# Patient Record
Sex: Male | Born: 1970 | Race: Black or African American | Hispanic: No | Marital: Single | Smoking: Current every day smoker
Health system: Southern US, Community
[De-identification: ages and names within clinical notes are randomized; demographics above are authoritative.]

## PROBLEM LIST (undated history)

## (undated) DIAGNOSIS — D492 Neoplasm of unspecified behavior of bone, soft tissue, and skin: Secondary | ICD-10-CM

## (undated) HISTORY — PX: OTHER SURGICAL HISTORY: SHX169

---

## 2009-03-31 HISTORY — PX: OTHER SURGICAL HISTORY: SHX169

## 2010-05-01 HISTORY — PX: OTHER SURGICAL HISTORY: SHX169

## 2015-10-18 ENCOUNTER — Encounter: Payer: Self-pay | Admitting: Neurology

## 2016-05-21 ENCOUNTER — Encounter (HOSPITAL_COMMUNITY): Payer: Self-pay | Admitting: Cardiology

## 2016-05-21 ENCOUNTER — Emergency Department (HOSPITAL_COMMUNITY)
Admission: EM | Admit: 2016-05-21 | Discharge: 2016-05-21 | Disposition: A | Discharge status: Home / Self Care | Payer: BLUE CROSS/BLUE SHIELD | Attending: Emergency Medicine | Admitting: Emergency Medicine

## 2016-05-21 ENCOUNTER — Emergency Department (HOSPITAL_COMMUNITY): Payer: BLUE CROSS/BLUE SHIELD

## 2016-05-21 DIAGNOSIS — M25561 Pain in right knee: Secondary | ICD-10-CM | POA: Insufficient documentation

## 2016-05-21 DIAGNOSIS — Y939 Activity, unspecified: Secondary | ICD-10-CM | POA: Diagnosis not present

## 2016-05-21 DIAGNOSIS — F17200 Nicotine dependence, unspecified, uncomplicated: Secondary | ICD-10-CM | POA: Diagnosis not present

## 2016-05-21 DIAGNOSIS — Y92009 Unspecified place in unspecified non-institutional (private) residence as the place of occurrence of the external cause: Secondary | ICD-10-CM | POA: Insufficient documentation

## 2016-05-21 DIAGNOSIS — S8991XA Unspecified injury of right lower leg, initial encounter: Secondary | ICD-10-CM | POA: Diagnosis present

## 2016-05-21 DIAGNOSIS — Y999 Unspecified external cause status: Secondary | ICD-10-CM | POA: Diagnosis not present

## 2016-05-21 DIAGNOSIS — W010XXA Fall on same level from slipping, tripping and stumbling without subsequent striking against object, initial encounter: Secondary | ICD-10-CM | POA: Insufficient documentation

## 2016-05-21 MED ORDER — IBUPROFEN 400 MG PO TABS
600.0000 mg | ORAL_TABLET | Freq: Once | ORAL | Status: AC
  Administered 2016-05-21: 600 mg via ORAL
  Filled 2016-05-21: qty 2

## 2016-05-21 MED ORDER — ACETAMINOPHEN 325 MG PO TABS
650.0000 mg | ORAL_TABLET | Freq: Once | ORAL | Status: AC
  Administered 2016-05-21: 650 mg via ORAL
  Filled 2016-05-21: qty 2

## 2016-05-21 NOTE — ED Triage Notes (Signed)
Twisted right knee yesterday.  Fell on ice

## 2016-05-21 NOTE — ED Notes (Signed)
After questions encouraged and answered. Pt is discharged. Crutch education and work note No other questions noted

## 2016-05-21 NOTE — ED Provider Notes (Signed)
[facility]-EM[facility]Y DEPT Provider Note   CSN: [id number] Arrival date & time: 05/21/16  0957   By signing my name below, I, Bobbie Stackhristopher Reid, attest that this documentation has been prepared under the direction and in the presence of Azalia BilisKevin Ozan Maclay, MD. Electronically Signed: Bobbie Stackhristopher Reid, Scribe. 05/21/16. 10:36 AM. History   Chief Complaint Chief Complaint  Patient presents with  . Knee Injury    The history is provided by the patient. No language interpreter was used.    HPI Comments: Charles Baker is a 46 y.o. male who presents to the Emergency Department complaining of right knee pain s/p fall that occurred yesterday morning around 9 am. Patient states that he was at his house yesterday and slipped on some black ice. He reports that he believe that he twisted his knee. He took 2 tablets of aleve yesterday with no significant improvement.  History reviewed. No pertinent past medical history.  There are no active problems to display for this patient.   Past Surgical History:  Procedure Laterality Date  . tumor     tumor removed from neck in 2009       Home Medications    Prior to Admission medications   Not on File    Family History History reviewed. No pertinent family history.  Social History Social History  Substance Use Topics  . Smoking status: Current Every Day Smoker  . Smokeless tobacco: Never Used  . Alcohol use Yes     Comment: beer occasional      Allergies   Patient has no known allergies.   Review of Systems Review of Systems A complete 10 system review of systems was obtained and all systems are negative except as noted in the HPI and PMH.    Physical Exam Updated Vital Signs BP 124/77 (BP Location: Left Arm)   Pulse 80   Temp 98 F (36.7 C) (Oral)   Resp 18   Ht 5\' 11"  (1.803 m)   Wt 190 lb (86.2 kg)   SpO2 100%   BMI 26.50 kg/m   Physical Exam  Constitutional: He is oriented to person, place, and time. He appears  well-developed and well-nourished.  HENT:  Head: Normocephalic.  Eyes: EOM are normal.  Neck: Normal range of motion.  Pulmonary/Chest: Effort normal.  Abdominal: He exhibits no distension.  Musculoskeletal:  Normal extension of right knee. Painful ROM of right knee with mild swelling at the medial aspect.  Neurological: He is alert and oriented to person, place, and time.  Psychiatric: He has a normal mood and affect.  Nursing note and vitals reviewed.    ED Treatments / Results  DIAGNOSTIC STUDIES: Oxygen Saturation is 100% on RA, normal by my interpretation.    COORDINATION OF CARE: 10:37 AM Discussed treatment plan with pt at bedside and pt agreed to plan.  Labs (all labs ordered are listed, but only abnormal results are displayed) Labs Reviewed - No data to display  EKG  EKG Interpretation None       Radiology Dg Knee Complete 4 Views Right  Result Date: 05/21/2016 CLINICAL DATA:  Pain after fall EXAM: RIGHT KNEE - COMPLETE 4+ VIEW COMPARISON:  None. FINDINGS: No evidence of fracture, dislocation, or joint effusion. No evidence of arthropathy or other focal bone abnormality. Soft tissues are unremarkable. IMPRESSION: Negative. Electronically Signed   By: Gerome Samavid  Williams III M.D   On: 05/21/2016 11:02    Procedures Procedures (including critical care time)  Medications Ordered in ED Medications  acetaminophen (TYLENOL) tablet 650 mg (650 mg Oral Given 05/21/16 1047)  ibuprofen (ADVIL,MOTRIN) tablet 600 mg (600 mg Oral Given 05/21/16 1047)     Initial Impression / Assessment and Plan / ED Course  I have reviewed the triage vital signs and the nursing notes.  Pertinent labs & imaging results that were available during my care of the patient were reviewed by me and considered in my medical decision making (see chart for details).     11:08 AM Home with instructions for ice and rest.  Knee immobilization.  Crutches for comfort.  Ibuprofen and Tylenol for home.   He'll need orthopedic follow-up and possible MRI for evaluation of internal derangement.  Final Clinical Impressions(s) / ED Diagnoses   Final diagnoses:  Acute pain of right knee    New Prescriptions New Prescriptions   No medications on file   I personally performed the services described in this documentation, which was scribed in my presence. The recorded information has been reviewed and is accurate.       Azalia Bilis, MD 05/21/16 [phone]

## 2016-05-21 NOTE — ED Notes (Signed)
Went to do vitals pt gone to radiology 

## 2017-06-12 IMAGING — DX DG KNEE COMPLETE 4+V*R*
4 series · 4 of 4 positions shown · non-contrast
Comparison: None.

CLINICAL DATA: Pain after fall

EXAM:
RIGHT KNEE - COMPLETE 4+ VIEW

[knee ap]
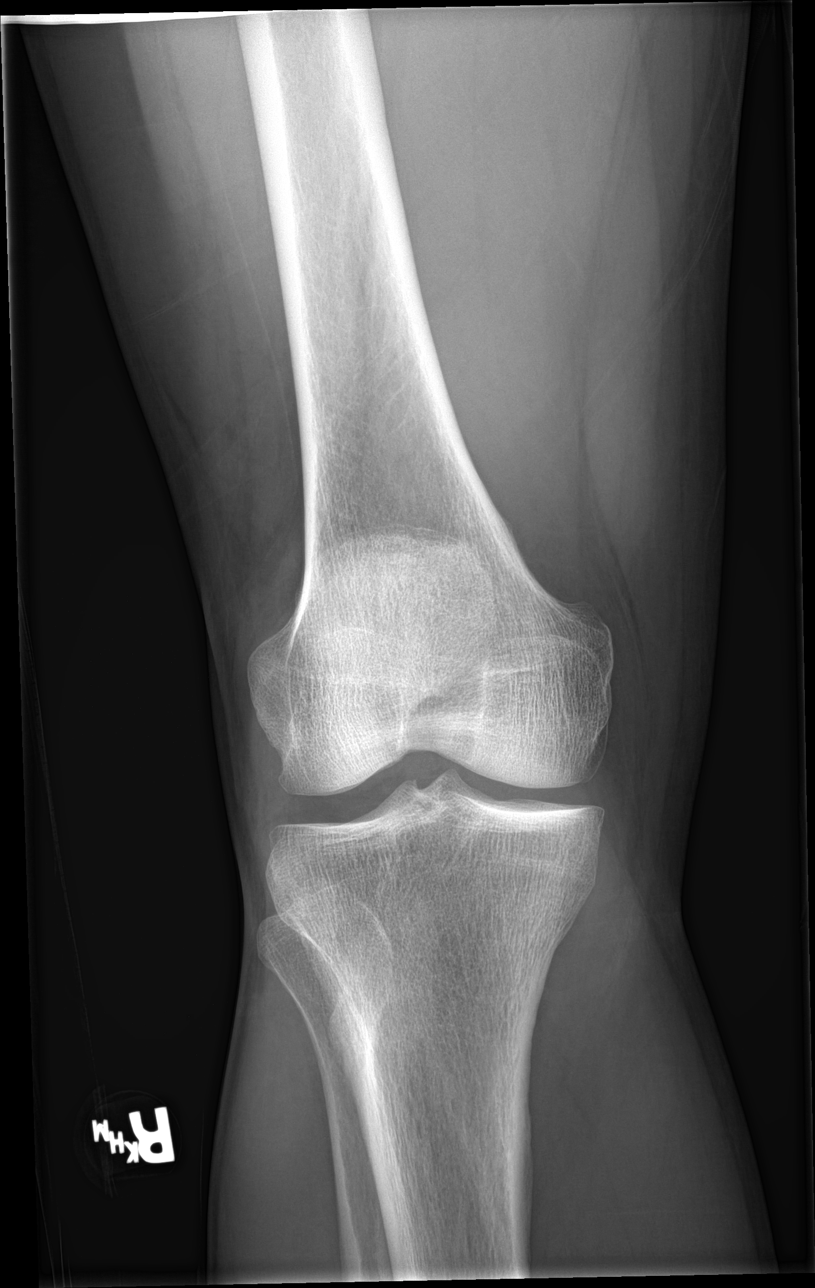

[knee obl (1 of 2)]
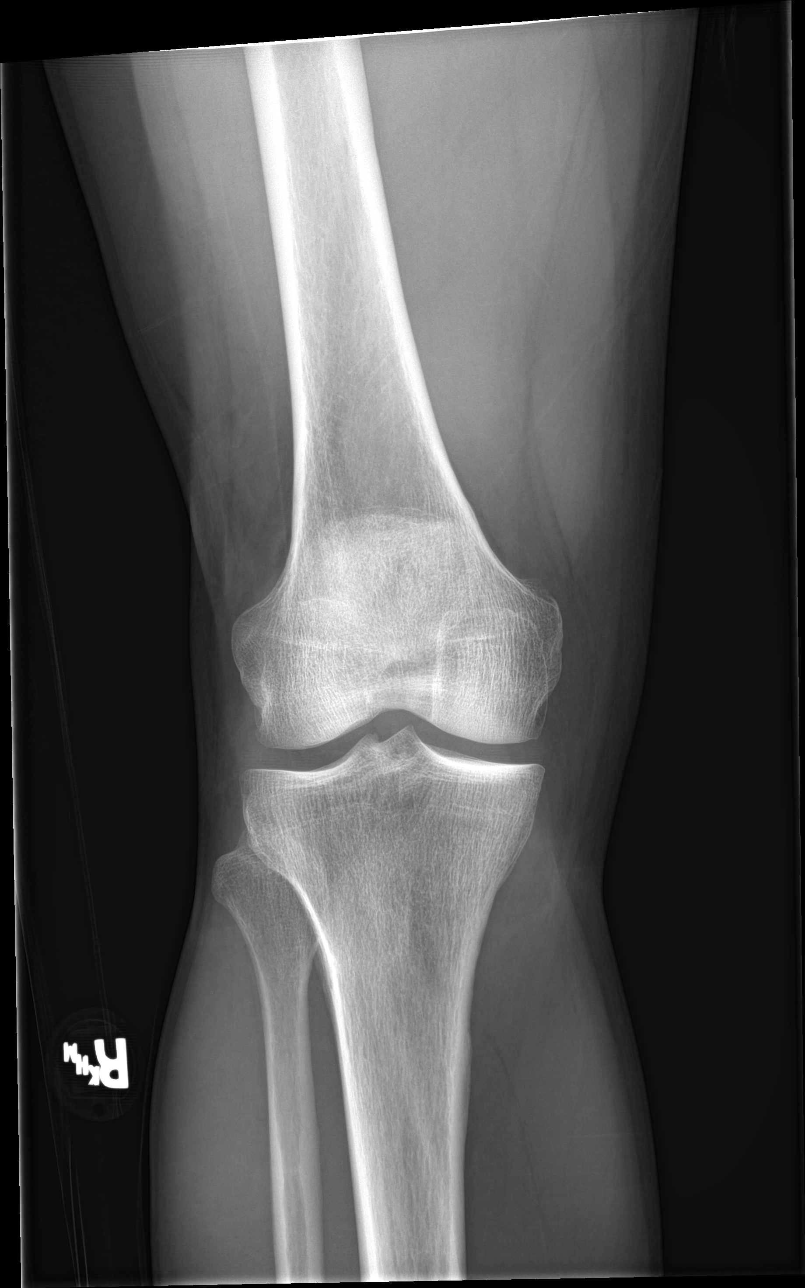

[knee obl (2 of 2)]
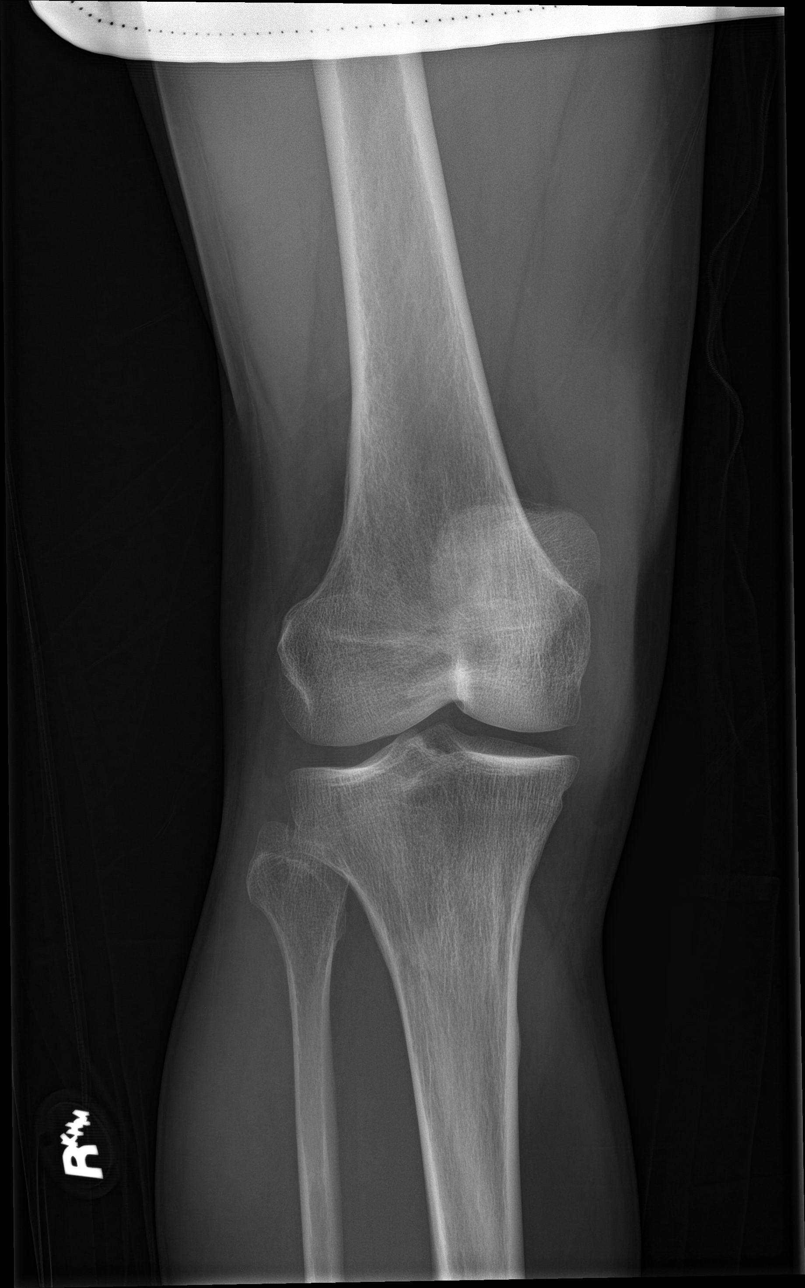

[knee lat]
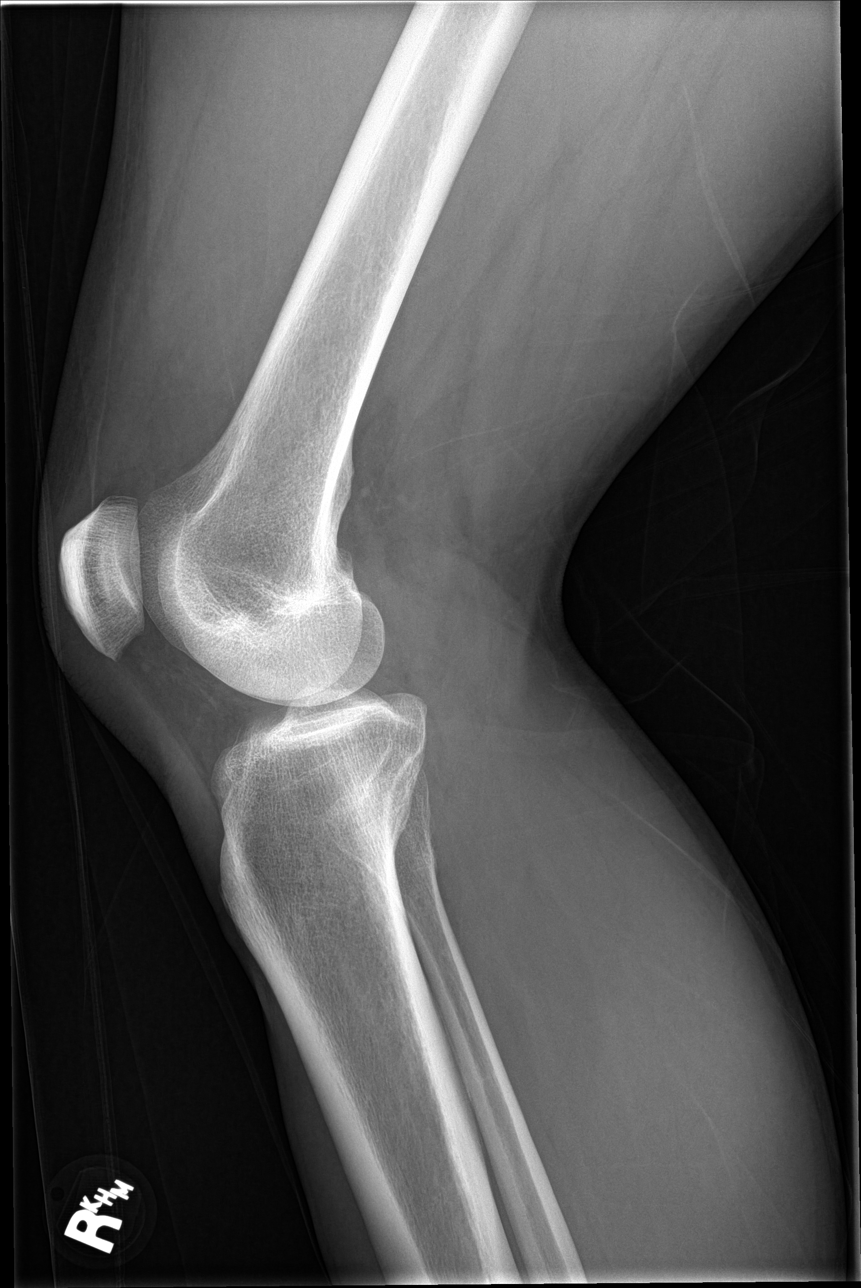

[4 of 4 positions shown; findings below may reference images not displayed]

FINDINGS: No evidence of fracture, dislocation, or joint effusion. No evidence
of arthropathy or other focal bone abnormality. Soft tissues are
unremarkable.
IMPRESSION: Negative.

## 2022-01-19 ENCOUNTER — Encounter: Payer: Self-pay | Admitting: Family Medicine

## 2022-01-19 ENCOUNTER — Ambulatory Visit (INDEPENDENT_AMBULATORY_CARE_PROVIDER_SITE_OTHER): Payer: Self-pay | Admitting: Family Medicine

## 2022-01-19 VITALS — BP 114/80 | HR 77 | Temp 98.4°F | Ht 71.0 in | Wt 213.0 lb

## 2022-01-19 DIAGNOSIS — M25542 Pain in joints of left hand: Secondary | ICD-10-CM

## 2022-01-19 DIAGNOSIS — M25541 Pain in joints of right hand: Secondary | ICD-10-CM

## 2022-01-19 DIAGNOSIS — R29898 Other symptoms and signs involving the musculoskeletal system: Secondary | ICD-10-CM

## 2022-01-19 DIAGNOSIS — Z113 Encounter for screening for infections with a predominantly sexual mode of transmission: Secondary | ICD-10-CM

## 2022-01-19 DIAGNOSIS — Z7689 Persons encountering health services in other specified circumstances: Secondary | ICD-10-CM

## 2022-01-19 DIAGNOSIS — Z114 Encounter for screening for human immunodeficiency virus [HIV]: Secondary | ICD-10-CM

## 2022-01-19 DIAGNOSIS — Z1159 Encounter for screening for other viral diseases: Secondary | ICD-10-CM

## 2022-01-19 DIAGNOSIS — Z0000 Encounter for general adult medical examination without abnormal findings: Secondary | ICD-10-CM

## 2022-01-19 DIAGNOSIS — Z125 Encounter for screening for malignant neoplasm of prostate: Secondary | ICD-10-CM

## 2022-01-19 NOTE — Progress Notes (Signed)
Complete physical exam  Patient: Charles Baker   DOB: [birth year]   51 y.o. Male  MRN: [id number]  Subjective:    Chief Complaint  Patient presents with   Charles Baker is a 51 y.o. male who presents today to establish care and for a complete physical exam. He reports consuming a general diet. The patient does not participate in regular exercise at present. He generally feels well. He reports sleeping well. He does have additional problems to discuss today.   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 C/o electrical current down left arm that wakes him from sleep and lasts all day for the last 4 months. Accompanied by muscle atrophy. Pain so severe he is "biting" his hand and using ice for relief. LUE weakness. Previously saw Dr Rafael Bihari, Neurology, unable to obtain records because he was incarcerated at the time of treatment.  Has had no healthcare since 2012.  He would like an MRI and referral for evaluation. 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 Would like disability. Vitals:   01/19/22 1119  Pulse: 77  Temp: 98.4 F (36.9 C)  SpO2: 97%    Most recent fall risk assessment:    01/19/2022   11:08 AM  Fall Risk   Falls in the past year? 0  Number falls in past yr: 0  Injury with Fall? 0     Most recent depression screenings:    01/19/2022   11:17 AM  PHQ 2/9 Scores  PHQ - 2 Score 0  PHQ- 9 Score 1    Vision:Not within last year , Dental: No current dental problems, STD: The patient denies history of sexually transmitted disease., and PSA: Agrees to PSA testing  History reviewed. No pertinent past medical history. 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/2009   L-tumor biopsy  2012   tumor     tumor removed from neck in 2009   Social History   Tobacco Use   Smoking status:  Every Day    Packs/day: 0.50    Years: 15.00    Total pack years: 7.50    Types: Cigarettes   Smokeless tobacco: Never  Substance Use Topics   Alcohol use: Yes    Alcohol/week: 2.0 standard drinks of alcohol    Types: 2 Shots of liquor per week    Comment: beer occasional    Drug use: No   Family History  Problem Relation Age of Onset   COPD Maternal Grandmother    Stroke Paternal Grandfather    No Known Allergies    Patient Care Team: Rubie Maid, FNP as PCP - General (Family Medicine)   Outpatient Medications Prior to Visit  Medication Sig   [DISCONTINUED] naproxen sodium (ANAPROX) 220 MG tablet Take 440 mg by mouth 2 (two) times daily with a meal.   No facility-administered medications prior to visit.    Review of Systems  Constitutional: Negative.   HENT: Negative.    Eyes: Negative.   Respiratory: Negative.    Cardiovascular: Negative.   Gastrointestinal: Negative.   Genitourinary: Negative.   Musculoskeletal:  Positive for joint pain.  Skin: Negative.   Neurological:  Positive for sensory change and weakness.       Bilateral upper extremities weakness, shoulder numbness, pain  left upper extremity  Psychiatric/Behavioral: Negative.            Objective:     Pulse 77   Temp 98.4 F (36.9 C) (Oral)   Ht $R'5\' 11"'vN$  (1.803 m)   Wt 213 lb (96.6 kg)   SpO2 97%   BMI 29.71 kg/m  BP Readings from Last 3 Encounters:  05/21/16 124/77   Wt Readings from Last 3 Encounters:  01/19/22 213 lb (96.6 kg)  05/21/16 190 lb (86.2 kg)      Physical Exam Vitals and nursing note reviewed.  Constitutional:      Appearance: Normal appearance. He is normal weight.  HENT:     Head: Normocephalic and atraumatic.     Right Ear: Tympanic membrane, ear canal and external ear normal.     Left Ear: Tympanic membrane, ear canal and external ear normal.     Nose: Nose normal.     Mouth/Throat:     Mouth: Mucous membranes are moist.     Pharynx: Oropharynx is clear.   Eyes:     Extraocular Movements: Extraocular movements intact.     Conjunctiva/sclera: Conjunctivae normal.     Pupils: Pupils are equal, round, and reactive to light.  Neck:     Vascular: No carotid bruit.  Cardiovascular:     Rate and Rhythm: Normal rate and regular rhythm.     Pulses: Normal pulses.     Heart sounds: Normal heart sounds.  Pulmonary:     Effort: Pulmonary effort is normal.     Breath sounds: Normal breath sounds.  Abdominal:     General: Abdomen is flat. Bowel sounds are normal.     Palpations: Abdomen is soft.  Musculoskeletal:     Cervical back: Normal range of motion and neck supple.  Skin:    General: Skin is warm and dry.     Capillary Refill: Capillary refill takes less than 2 seconds.  Neurological:     General: No focal deficit present.     Mental Status: He is alert and oriented to person, place, and time. Mental status is at baseline.     Sensory: Sensory deficit present.     Motor: Weakness present.     Comments: Bilateral upper extremity weakness and tone with limited range of motion, swelling and erythema to distal interphalangeal joints,    Psychiatric:        Mood and Affect: Mood normal.        Behavior: Behavior normal.        Thought Content: Thought content normal.        Judgment: Judgment normal.      No results found for any visits on 01/19/22. Last CBC No results found for: "WBC", "HGB", "HCT", "MCV", "MCH", "RDW", "PLT" Last metabolic panel No results found for: "GLUCOSE", "NA", "K", "CL", "CO2", "BUN", "CREATININE", "EGFR", "CALCIUM", "PHOS", "PROT", "ALBUMIN", "LABGLOB", "AGRATIO", "BILITOT", "ALKPHOS", "AST", "ALT", "ANIONGAP" Last lipids No results found for: "CHOL", "HDL", "LDLCALC", "LDLDIRECT", "TRIG", "CHOLHDL" Last hemoglobin A1c No results found for: "HGBA1C" Last thyroid functions No results found for: "TSH", "T3TOTAL", "T4TOTAL", "THYROIDAB" Last vitamin D No results found for: "25OHVITD2", "25OHVITD3",  "VD25OH" Last vitamin B12 and Folate No results found for: "VITAMINB12", "FOLATE"      Assessment & Plan:    Routine Health Maintenance and Physical Exam   There is no immunization history on file for this patient.  Health Maintenance  Topic Date Due   COVID-19 Vaccine (1) Never done   Hepatitis  C Screening  Never done   TETANUS/TDAP  Never done   COLONOSCOPY (Pts 45-46yrs Insurance coverage will need to be confirmed)  Never done   Zoster Vaccines- Shingrix (1 of 2) Never done   INFLUENZA VACCINE  Never done   HIV Screening  Completed   HPV VACCINES  Aged Out    Discussed health benefits of physical activity, and encouraged him to engage in regular exercise appropriate for his age and condition.  Physical exam, annual  Encounter to establish care  Bilateral arm weakness  Joint pain in fingers of both hands  Prostate cancer screening  Need for hepatitis C screening test  Screening for HIV (human immunodeficiency virus)  Screening examination for STD (sexually transmitted disease)  Complete physical exam performed including screenings and labs. Declines vaccinations at this time. Will obtain MRI and place neurology referral for upper extremity weakness and pain. In the presence of joint pain, redness, and erythema to hands I will obtain labs to assess for rheumatoid arthritis.    Return in about 1 day (around 01/20/2022) for labs. And 1 year for routine physical exam.     Rubie Maid, FNP

## 2022-01-20 ENCOUNTER — Other Ambulatory Visit: Payer: Medicaid Other

## 2022-01-20 DIAGNOSIS — M25541 Pain in joints of right hand: Secondary | ICD-10-CM

## 2022-01-20 DIAGNOSIS — Z114 Encounter for screening for human immunodeficiency virus [HIV]: Secondary | ICD-10-CM

## 2022-01-20 DIAGNOSIS — Z113 Encounter for screening for infections with a predominantly sexual mode of transmission: Secondary | ICD-10-CM

## 2022-01-20 DIAGNOSIS — Z0000 Encounter for general adult medical examination without abnormal findings: Secondary | ICD-10-CM

## 2022-01-20 DIAGNOSIS — Z125 Encounter for screening for malignant neoplasm of prostate: Secondary | ICD-10-CM

## 2022-01-20 DIAGNOSIS — Z1159 Encounter for screening for other viral diseases: Secondary | ICD-10-CM

## 2022-01-20 LAB — CBC WITH DIFFERENTIAL/PLATELET
Absolute Monocytes: 765 cells/uL (ref 200–950)
Eosinophils Absolute: 405 cells/uL (ref 15–500)
HCT: 43.9 % (ref 38.5–50.0)
Hemoglobin: 15.6 g/dL (ref 13.2–17.1)
MCH: 34 pg — ABNORMAL HIGH (ref 27.0–33.0)
Monocytes Relative: 10.2 %

## 2022-01-21 LAB — LIPID PANEL
Cholesterol: 195 mg/dL (ref <200)
HDL: 53 mg/dL (ref >=40)
LDL Cholesterol (Calc): 96 mg/dL (calc)
Non-HDL Cholesterol (Calc): 142 mg/dL (calc) — ABNORMAL HIGH (ref <130)
Total CHOL/HDL Ratio: 3.7 (calc) (ref <5.0)
Triglycerides: 347 mg/dL — ABNORMAL HIGH (ref <150)

## 2022-01-21 LAB — COMPLETE METABOLIC PANEL WITH GFR
AG Ratio: 1.6 (calc) (ref 1.0–2.5)
ALT: 30 U/L (ref 9–46)
AST: 32 U/L (ref 10–35)
Alkaline phosphatase (APISO): 71 U/L (ref 35–144)
BUN: 14 mg/dL (ref 7–25)
CO2: 26 mmol/L (ref 20–32)
Calcium: 9.3 mg/dL (ref 8.6–10.3)
Chloride: 105 mmol/L (ref 98–110)
Creat: 0.95 mg/dL (ref 0.70–1.30)
Globulin: 2.8 g/dL (calc) (ref 1.9–3.7)
Glucose, Bld: 94 mg/dL (ref 65–99)
Potassium: 4.2 mmol/L (ref 3.5–5.3)
Sodium: 141 mmol/L (ref 135–146)
Total Bilirubin: 0.9 mg/dL (ref 0.2–1.2)
eGFR: 98 mL/min/{1.73_m2} (ref >=60)

## 2022-01-21 LAB — CBC WITH DIFFERENTIAL/PLATELET
Basophils Absolute: 38 cells/uL (ref 0–200)
Basophils Relative: 0.5 %
Lymphs Abs: 1943 cells/uL (ref 850–3900)
MCHC: 35.5 g/dL (ref 32.0–36.0)
MCV: 95.6 fL (ref 80.0–100.0)
MPV: 11.4 fL (ref 7.5–12.5)
Neutro Abs: 4350 cells/uL (ref 1500–7800)
Neutrophils Relative %: 58 %
Platelets: 176 10*3/uL (ref 140–400)
RBC: 4.59 10*6/uL (ref 4.20–5.80)
RDW: 12.1 % (ref 11.0–15.0)
WBC: 7.5 10*3/uL (ref 3.8–10.8)

## 2022-01-21 LAB — C. TRACHOMATIS/N. GONORRHOEAE RNA
C. trachomatis RNA, TMA: NOT DETECTED
N. gonorrhoeae RNA, TMA: NOT DETECTED

## 2022-01-21 LAB — C-REACTIVE PROTEIN: CRP: 1 mg/L (ref <8.0)

## 2022-01-21 LAB — SEDIMENTATION RATE: Sed Rate: 2 mm/h (ref 0–15)

## 2022-01-23 ENCOUNTER — Encounter: Payer: Self-pay | Admitting: Neurology

## 2022-01-23 ENCOUNTER — Telehealth: Payer: Self-pay

## 2022-01-23 ENCOUNTER — Encounter: Payer: Self-pay | Admitting: Family Medicine

## 2022-01-23 LAB — COMPLETE METABOLIC PANEL WITH GFR
Albumin: 4.4 g/dL (ref 3.6–5.1)
Total Protein: 7.2 g/dL (ref 6.1–8.1)

## 2022-01-23 LAB — PSA: PSA: 0.37 ng/mL (ref <=4.00)

## 2022-01-23 LAB — HIV ANTIBODY (ROUTINE TESTING W REFLEX): HIV 1&2 Ab, 4th Generation: NONREACTIVE

## 2022-01-23 LAB — RPR: RPR Ser Ql: NONREACTIVE

## 2022-01-23 LAB — HEPATITIS C ANTIBODY: Hepatitis C Ab: NONREACTIVE

## 2022-01-23 LAB — CBC WITH DIFFERENTIAL/PLATELET
Eosinophils Relative: 5.4 %
Total Lymphocyte: 25.9 %

## 2022-01-23 LAB — RHEUMATOID FACTOR: Rhuematoid fact SerPl-aCnc: <14 IU/mL (ref <14)

## 2022-01-23 NOTE — Telephone Encounter (Signed)
Pt called in wanting to ask NP about getting some pain meds stronger than ibuprofen until he gets all of his issues under control. Pt would like a call back if this is possible.  Cb#: [phone]

## 2022-01-23 NOTE — Telephone Encounter (Signed)
Spoke w/Arlene w/[facility], per pt request for MRI order, demographics and insurance card put up front to be fax  Fax :[phone] Phone: [phone]

## 2022-02-02 ENCOUNTER — Encounter: Payer: Self-pay | Admitting: Family Medicine

## 2022-02-02 DIAGNOSIS — M79602 Pain in left arm: Secondary | ICD-10-CM

## 2022-02-02 DIAGNOSIS — R29898 Other symptoms and signs involving the musculoskeletal system: Secondary | ICD-10-CM

## 2022-02-03 NOTE — Telephone Encounter (Signed)
Please advice  

## 2022-02-06 ENCOUNTER — Other Ambulatory Visit: Payer: Self-pay | Admitting: Family Medicine

## 2022-02-06 ENCOUNTER — Telehealth: Payer: Self-pay | Admitting: Family Medicine

## 2022-02-06 ENCOUNTER — Telehealth: Payer: Self-pay

## 2022-02-06 MED ORDER — DULOXETINE HCL 30 MG PO CPEP: 30.0000 mg | ORAL_CAPSULE | Freq: Every day | ORAL | 3 refills | Status: DC

## 2022-02-06 NOTE — Telephone Encounter (Signed)
Spoke with patient regarding MRI results obtained today via fax. I have placed a STAT neurosurgery referral for further management. His left arm pain is unrelieved by the Voataren gel, I have ordered Cymbalta 30mg  daily for his neuropathic pain and instructed him to call back if symptoms worsen.

## 2022-02-06 NOTE — Telephone Encounter (Signed)
Amber, NP called and spoke w/pt today.

## 2022-02-06 NOTE — Telephone Encounter (Signed)
Pt called in stating that he would like to find a way to discuss results of his MRI. Pt wanted to know if he needed to schedule an appt with NP to look over results. Please advise.  Cb#: [phone]

## 2022-02-13 ENCOUNTER — Telehealth: Payer: Self-pay | Admitting: Family Medicine

## 2022-02-13 MED ORDER — DULOXETINE HCL 30 MG PO CPEP: 30.0000 mg | ORAL_CAPSULE | Freq: Every day | ORAL | 3 refills | Status: DC

## 2022-02-13 MED ORDER — GABAPENTIN 300 MG PO CAPS: 300.0000 mg | ORAL_CAPSULE | Freq: Three times a day (TID) | ORAL | 3 refills | Status: DC

## 2022-02-13 NOTE — Telephone Encounter (Signed)
Patient requested medication change due to cost but was able to obtain original prescription.

## 2022-02-17 ENCOUNTER — Telehealth: Payer: Self-pay

## 2022-02-17 NOTE — Telephone Encounter (Signed)
Spoke w/pharmacy staff is aware and will not fill gabapentin per Amber,NP

## 2022-02-20 NOTE — Telephone Encounter (Signed)
Already called pharmacy, already been cancelled.

## 2022-03-28 NOTE — Progress Notes (Deleted)
Initial neurology clinic note  SERVICE DATE: 03/31/22  Reason for Evaluation: Consultation requested by Charles Meo, FNP for an opinion regarding burning pain in left arm with muscle atrophy. My final recommendations will be communicated back to the requesting physician by way of shared medical record or letter to requesting physician via [place] mail.  HPI: This is [name], a 51 y.o. ***-handed male with a medical history of *** who presents to neurology clinic with the chief complaint of ***. The patient is accompanied by ***.  ***  Patient established care with PCP, Charles Baker on 01/19/22. 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 He mentioned to PCP that he had burning in left arm for last 4 months and muscle atrophy.   Patient was previously seen by Dr. Sampson Goon in neurology many years ago while incarcerated with no medical care since 2012.  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 Stat referral was placed to NSGY and neurology.***  The patient has not*** had similar episodes of symptoms in the past. ***  Muscle bulk loss? *** Muscle pain? ***  Cramps/Twitching? *** Suggestion of myotonia/difficulty relaxing after contraction? ***  Fatigable weakness?*** Does strength improve after brief exercise?***  Able to brush hair/teeth without difficulty? *** Able to button shirts/use zips? *** Clumsiness/dropping grasped objects?*** Can you arise from squatted position easily? *** Able to get out of chair without using arms? *** Able to walk up steps easily? *** Use an assistive device to walk? *** Significant imbalance with walking? *** Falls?*** Any change in urine color, especially after exertion/physical activity? ***  The  patient denies*** symptoms suggestive of oculobulbar weakness including diplopia, ptosis, dysphagia, poor saliva control, dysarthria/dysphonia, impaired mastication, facial weakness/droop.  There are no*** neuromuscular respiratory weakness symptoms, particularly orthopnea>dyspnea.   Pseudobulbar affect is absent***.  The patient does not*** report symptoms referable to autonomic dysfunction including impaired sweating, heat or cold intolerance, excessive mucosal dryness, gastroparetic early satiety, postprandial abdominal bloating, constipation, bowel or bladder dyscontrol, erectile dysfunction*** or syncope/presyncope/orthostatic intolerance.  There are no*** complaints relating to other symptoms of small fiber modalities including paresthesia/pain.  The patient has not *** noticed any recent skin rashes nor does he*** report any constitutional symptoms like fever, night sweats, anorexia or unintentional weight loss.  EtOH use: ***  Restrictive diet? *** Family history of neuropathy/myopathy/NM disease?***  Previous labs, electrodiagnostics, and neuroimaging are summarized below, but pertinent findings include***  Any biopsy done? *** Current medications being tried for the patient's symptoms include ***  Prior medications that have been tried: ***   MEDICATIONS:  Outpatient Encounter Medications as of 03/31/2022  Medication Sig   DULoxetine (CYMBALTA) 30 MG capsule Take 1 capsule (30 mg total) by mouth daily.   No facility-administered encounter medications on file as of 03/31/2022.    PAST MEDICAL HISTORY: No past medical history on file.  PAST SURGICAL HISTORY: 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/2009   L-tumor biopsy  2012   tumor     tumor removed from neck in 2009    ALLERGIES: No Known Allergies  FAMILY HISTORY: Family History  Problem Relation Age of Onset   COPD Maternal Grandmother  Stroke Paternal Grandfather      SOCIAL HISTORY: Social History   Tobacco Use   Smoking status: Every Day    Packs/day: 0.50    Years: 15.00    Total pack years: 7.50    Types: Cigarettes   Smokeless tobacco: Never  Substance Use Topics   Alcohol use: Yes    Alcohol/week: 2.0 standard drinks of alcohol    Types: 2 Shots of liquor per week    Comment: beer occasional    Drug use: No   Social History   Social History Narrative   Not on file     OBJECTIVE: PHYSICAL EXAM: There were no vitals taken for this visit.  General:*** General appearance: Awake and alert. No distress. Cooperative with exam.  Skin: No obvious rash or jaundice. HEENT: Atraumatic. Anicteric. Lungs: Non-labored breathing on room air  Heart: Regular Abdomen: Soft, non tender. Extremities: No edema. No obvious deformity.  Musculoskeletal: No obvious joint swelling. Psych: Affect appropriate.  Neurological: Mental Status: Alert. Speech fluent. No pseudobulbar affect Cranial Nerves: CNII: No RAPD. Visual fields grossly intact. CNIII, IV, VI: PERRL. No nystagmus. EOMI. CN V: Facial sensation intact bilaterally to fine touch. Masseter clench strong. Jaw jerk***. CN VII: Facial muscles symmetric and strong. No ptosis at rest or after sustained upgaze***. CN VIII: Hearing grossly intact bilaterally. CN IX: No hypophonia. CN X: Palate elevates symmetrically. CN XI: Full strength shoulder shrug bilaterally. CN XII: Tongue protrusion full and midline. No atrophy or fasciculations. No significant dysarthria*** Motor: Tone is ***. *** fasciculations in *** extremities. *** atrophy. No grip or percussive myotonia.***  Individual muscle group testing (MRC grade out of 5):  Movement     Neck flexion ***    Neck extension ***     Right Left   Shoulder abduction *** ***   Shoulder adduction *** ***   Shoulder ext rotation *** ***   Shoulder int rotation *** ***   Elbow flexion *** ***   Elbow extension *** ***   Wrist  extension *** ***   Wrist flexion *** ***   Finger abduction - FDI *** ***   Finger abduction - ADM *** ***   Finger extension *** ***   Finger distal flexion - 2/3 *** ***   Finger distal flexion - 4/5 *** ***   Thumb flexion - FPL *** ***   Thumb abduction - APB *** ***    Hip flexion *** ***   Hip extension *** ***   Hip adduction *** ***   Hip abduction *** ***   Knee extension *** ***   Knee flexion *** ***   Dorsiflexion *** ***   Plantarflexion *** ***   Inversion *** ***   Eversion *** ***   Great toe extension *** ***   Great toe flexion *** ***     Reflexes:  Right Left   Bicep *** ***   Tricep *** ***   BrRad *** ***   Knee *** ***   Ankle *** ***    Pathological Reflexes: Babinski: *** response bilaterally*** Hoffman: *** Troemner: *** Pectoral: *** Palmomental: *** Facial: *** Midline tap: *** Sensation: Pinprick: *** Vibration: *** Temperature: *** Proprioception: *** Coordination: Intact finger-to- nose-finger bilaterally. Romberg negative.*** Gait: Able to rise from chair with arms crossed unassisted. Normal, narrow-based gait. Able to tandem walk. Able to walk on toes and heels.***  Lab and Test Review: Internal labs: Normal or unremarkable: CBC, CMP, HIV, RPR, Hep C panel  External labs: ***  MRI cervical spine (01/30/22):   MRI right brachial plexus w/wo (08/28/2008): FINDINGS: There is no abnormal enhancement in the region of the  right brachial plexus roots, trunks, divisions, cords or  branches.  The trunks and divisions are poorly defined which may  be consistent with plexopathy/prior injury.  No masses are  identified.  The osseous structures are unremarkable.  Limited  views of the spinal canal are normal.     IMPRESSION: Poor definition of trunks and divisions which may be  consistent with prior injury. No mass.   ***  ASSESSMENT: GEDDY BOYDSTUN is a 51 y.o. male who presents for evaluation of ***. *** has a  relevant medical history of ***. *** neurological examination is pertinent for ***. Available diagnostic data is significant for ***. This constellation of symptoms and objective data would most likely localize to ***. ***  PLAN: -Blood work: *** ***  -Return to clinic ***  The impression above as well as the plan as outlined below were extensively discussed with the patient (in the company of ***) who voiced understanding. All questions were answered to their satisfaction.  The patient was counseled on pertinent fall precautions per the printed material provided today, and as noted under the "Patient Instructions" section below.***  When available, results of the above investigations and possible further recommendations will be communicated to the patient via telephone/MyChart. Patient to call office if not contacted after expected testing turnaround time.   Total time spent reviewing records, interview, history/exam, documentation, and coordination of care on day of encounter:  *** min   Thank you for allowing me to participate in patient's care.  If I can answer any additional questions, I would be pleased to do so.  Jacquelyne Balint, MD   CC: Charles Meo, FNP [address] [place] [address]  CC: Referring provider: Park Meo, FNP [address] [place],  [place] [address]

## 2022-03-31 ENCOUNTER — Ambulatory Visit: Payer: Self-pay | Admitting: Neurology

## 2022-04-05 ENCOUNTER — Other Ambulatory Visit: Payer: Self-pay | Admitting: Family Medicine

## 2022-04-05 ENCOUNTER — Telehealth: Payer: Self-pay | Admitting: Family Medicine

## 2022-04-05 DIAGNOSIS — M79602 Pain in left arm: Secondary | ICD-10-CM

## 2022-04-05 MED ORDER — DULOXETINE HCL 60 MG PO CPEP: 60.0000 mg | ORAL_CAPSULE | Freq: Two times a day (BID) | ORAL | 1 refills | Status: DC

## 2022-04-05 NOTE — Telephone Encounter (Signed)
Received call from patient's wife Charles Baker. Stated patient taking CYMBALTA for nerve pain and it's not helping; he was up all night in severe pain. Cream also not helping. She's requesting a different prescription; unable to get in for the neuro surgeon until January.  Pharmacy confirmed as:  [facility] [address] [place], [place] [address] Phone: [phone]  Requesting call back with provider's plan; patient doesn't use MyChart. Please advise at [phone], or [phone]

## 2022-04-05 NOTE — Telephone Encounter (Signed)
Pls advice  

## 2022-04-05 NOTE — Telephone Encounter (Signed)
Just spoke w/pt, he would like something for nerve pains, he stated that he is in a lot of pain, work sent him home today due to pain.   Pls advice?

## 2022-04-06 NOTE — Telephone Encounter (Signed)
Called and spoke w/pt. Per pcp, did try to get med (Gabapentin) however pt's insurance was not going to cover it. Pt voiced understanding and nothing further.

## 2022-05-09 NOTE — Progress Notes (Deleted)
Initial neurology clinic note  SERVICE DATE: 05/10/22  Reason for Evaluation: Consultation requested by Rubie Maid, FNP for an opinion regarding ***. My final recommendations will be communicated back to the requesting physician by way of shared medical record or letter to requesting physician via [place] mail.  HPI: This is [name], a 52 y.o. ***-handed male with a medical history of *** who presents to neurology clinic with the chief complaint of ***. The patient is accompanied by ***.  ***  Patient established care with PCP, Mila Merry on 01/19/22. 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 He mentioned to PCP that he had burning in left arm for last 4 months and muscle atrophy.   Patient was previously seen by Dr. Rafael Bihari in neurology many years ago while incarcerated with no medical care since 2012.  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 Stat referral was placed to [facility] and neurology.***  Vision problems? *** Hearing loss/tinnitus? *** Skin abnormalities? *** Headaches? ***  ***  The patient has not*** had similar episodes of symptoms in the past. ***  Muscle bulk loss? *** Muscle pain? ***  Cramps/Twitching? *** Suggestion of myotonia/difficulty relaxing after contraction? ***  Fatigable weakness?*** Does strength improve after brief exercise?***  Able to brush hair/teeth without difficulty? *** Able to button shirts/use zips? *** Clumsiness/dropping grasped objects?*** Can you arise from squatted position easily? *** Able to get out of chair without using arms? *** Able to walk up steps easily? *** Use an assistive device to walk? *** Significant imbalance with walking? *** Falls?*** Any change in urine  color, especially after exertion/physical activity? ***  The patient denies*** symptoms suggestive of oculobulbar weakness including diplopia, ptosis, dysphagia, poor saliva control, dysarthria/dysphonia, impaired mastication, facial weakness/droop.  There are no*** neuromuscular respiratory weakness symptoms, particularly orthopnea>dyspnea.   Pseudobulbar affect is absent***.  The patient does not*** report symptoms referable to autonomic dysfunction including impaired sweating, heat or cold intolerance, excessive mucosal dryness, gastroparetic early satiety, postprandial abdominal bloating, constipation, bowel or bladder dyscontrol, erectile dysfunction*** or syncope/presyncope/orthostatic intolerance.  There are no*** complaints relating to other symptoms of small fiber modalities including paresthesia/pain.  The patient has not *** noticed any recent skin rashes nor does he*** report any constitutional symptoms like fever, night sweats, anorexia or unintentional weight loss.  EtOH use: ***  Restrictive diet? *** Family history of neuropathy/myopathy/NM disease?***  Previous labs, electrodiagnostics, and neuroimaging are summarized below, but pertinent findings include***  Any biopsy done? *** Current medications being tried for the patient's symptoms include ***  Prior medications that have been tried: ***   MEDICATIONS:  Outpatient Encounter Medications as of 05/10/2022  Medication Sig   DULoxetine (CYMBALTA) 60 MG capsule Take 1 capsule (60 mg total) by mouth 2 (two) times daily.   No facility-administered encounter medications on file as of 05/10/2022.    PAST MEDICAL HISTORY: No past medical history on file.  PAST SURGICAL HISTORY: 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/2009   L-tumor biopsy  2012   tumor     tumor removed from neck in 2009    ALLERGIES: No Known Allergies  FAMILY HISTORY: Family History  Problem Relation  Age of Onset   COPD Maternal Grandmother    Stroke Paternal Grandfather     SOCIAL HISTORY: Social History   Tobacco Use   Smoking status: Every Day    Packs/day: 0.50    Years: 15.00    Total pack years: 7.50    Types: Cigarettes   Smokeless tobacco: Never  Substance Use Topics   Alcohol use: Yes    Alcohol/week: 2.0 standard drinks of alcohol    Types: 2 Shots of liquor per week    Comment: beer occasional    Drug use: No   Social History   Social History Narrative   Not on file     OBJECTIVE: PHYSICAL EXAM: There were no vitals taken for this visit.  General:*** General appearance: Awake and alert. No distress. Cooperative with exam.  Skin:  -Cafe au lait spots*** -cutaneous neurofibromas*** -Freckling of armpits/groin*** -Lisch nodules on eyes*** HEENT: Atraumatic. Anicteric. Lungs: Non-labored breathing on room air  Heart: Regular Abdomen: Soft, non tender. Extremities: No edema. No obvious deformity.  Musculoskeletal: No obvious joint swelling. Psych: Affect appropriate.  Neurological: Mental Status: Alert. Speech fluent. No pseudobulbar affect Cranial Nerves: CNII: No RAPD. Visual fields grossly intact. CNIII, IV, VI: PERRL. No nystagmus. EOMI. CN V: Facial sensation intact bilaterally to fine touch. Masseter clench strong. Jaw jerk***. CN VII: Facial muscles symmetric and strong. No ptosis at rest or after sustained upgaze***. CN VIII: Hearing grossly intact bilaterally. CN IX: No hypophonia. CN X: Palate elevates symmetrically. CN XI: Full strength shoulder shrug bilaterally. CN XII: Tongue protrusion full and midline. No atrophy or fasciculations. No significant dysarthria*** Motor: Tone is ***. *** fasciculations in *** extremities. *** atrophy. No grip or percussive myotonia.***  Individual muscle group testing (MRC grade out of 5):  Movement     Neck flexion ***    Neck extension ***     Right Left   Shoulder abduction *** ***    Shoulder adduction *** ***   Shoulder ext rotation *** ***   Shoulder int rotation *** ***   Elbow flexion *** ***   Elbow extension *** ***   Wrist extension *** ***   Wrist flexion *** ***   Finger abduction - FDI *** ***   Finger abduction - ADM *** ***   Finger extension *** ***   Finger distal flexion - 2/3 *** ***   Finger distal flexion - 4/5 *** ***   Thumb flexion - FPL *** ***   Thumb abduction - APB *** ***    Hip flexion *** ***   Hip extension *** ***   Hip adduction *** ***   Hip abduction *** ***   Knee extension *** ***   Knee flexion *** ***   Dorsiflexion *** ***   Plantarflexion *** ***   Inversion *** ***   Eversion *** ***   Great toe extension *** ***   Great toe flexion *** ***     Reflexes:  Right Left   Bicep *** ***   Tricep *** ***   BrRad *** ***   Knee *** ***   Ankle *** ***    Pathological Reflexes: Babinski: *** response bilaterally*** Hoffman: *** Troemner: *** Pectoral: *** Palmomental: *** Facial: *** Midline tap: *** Sensation: Pinprick: *** Vibration: *** Temperature: *** Proprioception: *** Coordination: Intact finger-to- nose-finger bilaterally. Romberg negative.*** Gait: Able to rise from chair with arms crossed unassisted. Normal, narrow-based gait. Able to tandem walk. Able to walk on toes and heels.***  Lab  and Test Review: Internal labs: Normal or unremarkable: CBC, CMP, HIV, RPR, Hep C panel ***  External labs: ***  Imaging: MRI cervical spine (01/30/22):    MRI right brachial plexus w/wo (08/28/2008): FINDINGS: There is no abnormal enhancement in the region of the  right brachial plexus roots, trunks, divisions, cords or  branches.  The trunks and divisions are poorly defined which may  be consistent with plexopathy/prior injury.  No masses are  identified.  The osseous structures are unremarkable.  Limited  views of the spinal canal are normal.     IMPRESSION: Poor definition of trunks and  divisions which may be  consistent with prior injury. No mass.   ***  ASSESSMENT: Charles Baker is a 51 y.o. male who presents for evaluation of ***. *** has a relevant medical history of ***. *** neurological examination is pertinent for ***. Available diagnostic data is significant for ***. This constellation of symptoms and objective data would most likely localize to ***. ***  PLAN: -Blood work: *** ***  -Return to clinic ***  The impression above as well as the plan as outlined below were extensively discussed with the patient (in the company of ***) who voiced understanding. All questions were answered to their satisfaction.  The patient was counseled on pertinent fall precautions per the printed material provided today, and as noted under the "Patient Instructions" section below.***  When available, results of the above investigations and possible further recommendations will be communicated to the patient via telephone/MyChart. Patient to call office if not contacted after expected testing turnaround time.   Total time spent reviewing records, interview, history/exam, documentation, and coordination of care on day of encounter:  *** min   Thank you for allowing me to participate in patient's care.  If I can answer any additional questions, I would be pleased to do so.  Kai Levins, MD   CC: Rubie Maid, FNP [id number] [id number]  CC: Referring provider: Rubie Maid, [place] [place],   [id number]

## 2022-05-10 ENCOUNTER — Ambulatory Visit: Payer: Medicaid Other | Admitting: Neurology

## 2023-02-05 ENCOUNTER — Ambulatory Visit: Payer: Medicaid Other | Admitting: Family Medicine

## 2023-02-12 ENCOUNTER — Encounter: Payer: Self-pay | Admitting: Family Medicine

## 2023-02-12 ENCOUNTER — Ambulatory Visit: Payer: Medicaid Other | Admitting: Family Medicine

## 2023-02-12 VITALS — BP 120/60 | HR 78 | Temp 98.0°F | Ht 71.0 in | Wt 205.0 lb

## 2023-02-12 DIAGNOSIS — Z1211 Encounter for screening for malignant neoplasm of colon: Secondary | ICD-10-CM | POA: Diagnosis not present

## 2023-02-12 DIAGNOSIS — G540 Brachial plexus disorders: Secondary | ICD-10-CM

## 2023-02-12 MED ORDER — GABAPENTIN 100 MG PO CAPS: 100.0000 mg | ORAL_CAPSULE | Freq: Three times a day (TID) | ORAL | 3 refills | Status: DC

## 2023-02-12 NOTE — Assessment & Plan Note (Signed)
Symptoms unchanged from prior exam. Weakness and decreased sensation to BUE, LUE>RUE. Sensory changes RLE consistent with radiculopathy. No saddle numbness or urinary or fecal incontinence. 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 Will refer to Neurosurgery. Start Gabapentin 100mg  TID for nerve pain.

## 2023-02-12 NOTE — Progress Notes (Signed)
Subjective:  HPI: Charles Baker is a 52 y.o. male presenting on 02/12/2023 for No chief complaint on file.   HPI Patient is in today for worsening pain in his left hand that is now occurring in his right hand on the thumb side, he is having associated weakness. These symptoms are not new but are worsening. He is also experiencing numbness to his right posterior calf and bottom of his foot for several weeks now. He does have a history of C6 nerve root tumor removal in 2009 and has had similar symptoms since then, he was told he has another tumor on his left side that has not been addressed.Marland Kitchen He was referred by me to Neurology last year at his initial visit to establish care. MRI 01/30/2022 did show nerve root tumor.   Review of Systems  All other systems reviewed and are negative.   Relevant past medical history reviewed and updated as indicated.   No past medical history on file.   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/2009   L-tumor biopsy  2012   tumor     tumor removed from neck in 2009    Allergies and medications reviewed and updated.   Current Outpatient Medications:    amoxicillin (AMOXIL) 500 MG tablet, Take 500 mg by mouth 3 (three) times daily., Disp: , Rfl:    gabapentin (NEURONTIN) 100 MG capsule, Take 1 capsule (100 mg total) by mouth 3 (three) times daily., Disp: 90 capsule, Rfl: 3  No Known Allergies  Objective:   BP 120/60   Pulse 78   Temp 98 F (36.7 C) (Oral)   Ht 5\' 11"  (1.803 m)   Wt 205 lb (93 kg)   SpO2 92%   BMI 28.59 kg/m      02/12/2023   12:14 PM 01/19/2022   11:19 AM 05/21/2016   10:27 AM  Vitals with BMI  Height 5\' 11"  5\' 11"  5\' 11"   Weight 205 lbs 213 lbs 190 lbs  BMI 28.6 29.72 26.6  Systolic 120 114 161  Diastolic 60 80 77  Pulse 78 77 80     Physical Exam Vitals and nursing note reviewed.  Constitutional:      Appearance: Normal appearance. He is normal weight.  HENT:      Head: Normocephalic and atraumatic.  Skin:    General: Skin is warm and dry.     Capillary Refill: Capillary refill takes less than 2 seconds.  Neurological:     General: No focal deficit present.     Mental Status: He is alert and oriented to person, place, and time. Mental status is at baseline.     Sensory: Sensory deficit present.     Motor: Weakness present.  Psychiatric:        Mood and Affect: Mood normal.        Behavior: Behavior normal.        Thought Content: Thought content normal.        Judgment: Judgment normal.     Assessment & Plan:  Brachial plexus mass Assessment & Plan: Symptoms unchanged from prior exam. Weakness and decreased sensation to BUE, LUE>RUE. Sensory changes RLE consistent with radiculopathy. No saddle numbness or urinary or fecal incontinence. 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 Will refer to Neurosurgery. Start Gabapentin 100mg  TID for nerve pain.  Orders: -     Ambulatory referral to Neurosurgery  Colon cancer screening -     Cologuard  Other orders -     Gabapentin; Take 1 capsule (100 mg total) by mouth 3 (three) times daily.  Dispense: 90 capsule; Refill: 3     Follow up plan: Return for annual physical with labs 1 week prior.  Park Meo, FNP

## 2023-03-21 NOTE — Progress Notes (Addendum)
Referring Physician:  Park Meo, FNP [address] [place],  [place] [address]  Primary Physician:  Charles Meo, FNP  History of Present Illness: 03/26/2023 Charles Baker is here today with a chief complaint of peripheral nerve sheath tumors.  He has had a previous right sided peripheral nerve sheath tumor resected.  He has had significant weakness in his right upper extremity ever since that time.  On the left upper extremity he also has significant weakness as well.  He is lost significant amount of sensation in his left upper extremity.  He has had a previous brachial plexus MRI demonstrating a left-sided tumor.  Feels like he is unable to lift his left upper extremity has difficulty with elbow flexion as well as external rotation.  This has been present for at least the past 12 years.  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  Review of Systems:  A 10 point review of systems is negative, except for the pertinent positives and negatives detailed in the HPI.  Past Medical History: History reviewed. No pertinent past medical history.  Past Surgical History: 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/2009   L-tumor biopsy  2012   tumor     tumor removed from neck in 2009    Allergies: Allergies as of 03/26/2023   (No Known Allergies)    Medications:  Current Outpatient Medications:    gabapentin (NEURONTIN) 100 MG capsule, Take 1 capsule (100 mg total) by mouth 3 (three) times daily., Disp: 90 capsule, Rfl: 3  Social History: Social History   Tobacco Use   Smoking status: Every Day    Current packs/day: 0.50    Average packs/day: 0.5 packs/day for 15.0 years (7.5 ttl pk-yrs)    Types: Cigarettes   Smokeless tobacco: Never  Substance Use Topics   Alcohol use: Yes    Alcohol/week: 2.0 standard drinks of alcohol    Types: 2 Shots of liquor per week    Comment: beer occasional     Drug use: No    Family Medical History: Family History  Problem Relation Age of Onset   COPD Maternal Grandmother    Stroke Paternal Grandfather     Physical Examination: Vitals:   03/26/23 1510  BP: 114/76    General: Patient is in no apparent distress. Attention to examination is appropriate.  Neck:   Supple.  Full range of motion.  Respiratory: Patient is breathing without any difficulty.   NEUROLOGICAL:     Awake, alert, oriented to person, place, and time.  Speech is clear and fluent.   Cranial Nerves: Pupils equal round and reactive to light.  Facial tone is symmetric. Shoulder shrug is symmetric. Tongue protrusion is midline.  There is no pronator drift.  Motor Exam:  Significant weakness in the proximal musculature on the left upper extremity.  Weakness in the deltoid biceps wrist extension and external rotation.  Severe wasting of his proximal musculature.  Decreased reflexes of the left bicep  Decreased station in the left upper extremity in the upper trunk distribution.  Gait is normal.     Medical Decision Making  Imaging: Patient in need of updated imaging.  Previous imaging reviewed demonstrating a bilateral nerve sheath tumors.  His left-sided tumor is the one in question and appears to be arising from the upper trunk  I have personally reviewed the images and electrodiagnostics and agree with  the above interpretation.  Assessment and Plan: [name] is a pleasant 52 y.o. 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  The right was previously resected along with the nerve root and he has had a significant deficit ever since that time.  On the left upper extremity is had a previous biopsy.  He continues to have pain in the left upper extremity as well as weakness in the upper trunk distribution.  He has had this for many years.  At this point he feels like his pain is continuing to bother him and impact his life so would like to have further evaluation.   Since his MRI is dated we will plan on getting a repeat MRI of his left sided brachial plexus with and without contrast to explore the anatomy.  We discussed that we could potentially perform a resection of this nerve tumor, given his already weak upper extremity we may cause him to have further weakness.  Also concern for possible loss of sensation in the hand given the distribution in the upper trunk.  Will plan on getting this MRI and following up with him.  Spent 30 minutes on care for this patient today, we spent this reviewing his records, discussing his previous care, evaluating him for his active problems, planning for imaging, and planning for future treatments.  Thank you for involving me in the care of this patient.    Lovenia Kim MD/MSCR Neurosurgery - Peripheral Nerve Surgery

## 2023-03-26 ENCOUNTER — Ambulatory Visit: Payer: Medicaid Other | Admitting: Neurosurgery

## 2023-03-26 ENCOUNTER — Encounter: Payer: Self-pay | Admitting: Neurosurgery

## 2023-03-26 VITALS — BP 114/76 | Ht 71.0 in | Wt 201.0 lb

## 2023-03-26 DIAGNOSIS — G540 Brachial plexus disorders: Secondary | ICD-10-CM

## 2023-04-10 ENCOUNTER — Ambulatory Visit (HOSPITAL_COMMUNITY)
Admission: RE | Admit: 2023-04-10 | Discharge: 2023-04-10 | Disposition: A | Discharge status: Home / Self Care | Payer: Medicaid Other | Source: Ambulatory Visit | Attending: Neurosurgery | Admitting: Neurosurgery

## 2023-04-10 DIAGNOSIS — G540 Brachial plexus disorders: Secondary | ICD-10-CM | POA: Insufficient documentation

## 2023-04-10 MED ORDER — GADOBUTROL 1 MMOL/ML IV SOLN
10.0000 mL | Freq: Once | INTRAVENOUS | Status: AC | PRN
  Administered 2023-04-10: 10 mL via INTRAVENOUS

## 2023-05-09 ENCOUNTER — Ambulatory Visit: Payer: Medicaid Other | Admitting: Neurosurgery

## 2023-05-09 DIAGNOSIS — G540 Brachial plexus disorders: Secondary | ICD-10-CM | POA: Diagnosis not present

## 2023-05-09 NOTE — Progress Notes (Signed)
 I had a follow-up phone visit today with Charles Baker to discuss his left-sided brachial plexus tumor.  He was at home and I was in the clinic.  He gave consent to go forward with a phone visit.  We have been following him for his left-sided brachial plexus tumor, he has significant proximal left-sided weakness and has had this for a long time.  He has found ways to work around it.  His most troublesome symptom is nerve pain that goes down to his hand in the C5 and C6 distribution.  He has tried gabapentin  and different creams and unfortunately feels like these are giving him less and less relief.  He has been trying conservative management but feels like he is getting to the point where he needs to have a more definitive treatment.  We got an updated MRI of his brachial plexus which demonstrated a 2.9 x 2.0 upper trunk brachial plexus tumor.  The anatomy is clearly defined, appears to be right lateral to the scalene musculature.  We discussed a tumor resection.  Given his significant extensive weakness in his upper extremity already in this distribution it is unlikely that he would get significantly worse.  The major changes noted to a nerve tumor resection in this case would likely be sensation loss or failure to control his pain.  He feels like he is tried all of the conservative measures and would like to move forward with treatment.  He would like to have a few months to prepare for surgery and would ideally have this done in April.  Will plan on working on his scheduling.  All of his questions were answered to his approval.  Spent a total of 15 minutes discussing his care.

## 2023-05-14 ENCOUNTER — Ambulatory Visit: Payer: Medicaid Other | Admitting: Family Medicine

## 2023-06-04 ENCOUNTER — Ambulatory Visit (INDEPENDENT_AMBULATORY_CARE_PROVIDER_SITE_OTHER): Payer: Medicaid Other | Admitting: Neurosurgery

## 2023-06-04 DIAGNOSIS — G540 Brachial plexus disorders: Secondary | ICD-10-CM

## 2023-06-04 NOTE — Addendum Note (Signed)
Addended by: Sharlot Gowda on: 06/04/2023 05:19 PM   Modules accepted: Orders

## 2023-06-04 NOTE — Progress Notes (Signed)
I had a follow-up phone call today with Mr. Montalto.  He was at home and I was in the office.  He gave consent to go forward with a phone visit discussing his left brachial plexus tumor.  The last time we had discussed his left-sided brachial plexus tumor he was looking forward to having this resected, however he wanted to have time to get his finances and order and was preferring a date in April or May.  He is reaching out today for evaluation and planning for a brachial plexus exploration and tumor resection.  Continues to have significant symptoms, continues to take gabapentin for his pain but however he is having less and to control.  He is interested in having a consultation with a pain medicine physician.  I let him know that I did reach out to his primary care provider Ms. Howard, to help with coordination.  Will plan to post his surgery for a date in May as he feels like he is moving towards his goals to be set up financially for the time he will need to be out of work.  We spent approximately 10 minutes discussing his care.

## 2023-06-05 ENCOUNTER — Telehealth: Payer: Self-pay

## 2023-06-05 DIAGNOSIS — G540 Brachial plexus disorders: Secondary | ICD-10-CM

## 2023-06-05 DIAGNOSIS — G894 Chronic pain syndrome: Secondary | ICD-10-CM

## 2023-06-05 NOTE — Telephone Encounter (Signed)
-----   Message from Penne LELON Sharps sent at 06/05/2023  3:32 PM EST ----- Regarding: RE: [facility] Yes please ----- Message ----- From: Annasofia Pohl, RN Sent: 06/05/2023   9:23 AM EST To: Penne LELON Sharps, MD Subject: RE: [facility]                             [facility] in [place] does medication management only (and no injections). Do you want me to place a referral there? ----- Message ----- From: Sharps Penne LELON, MD Sent: 06/05/2023   7:14 AM EST To: Othelia Gunner, RN Subject: FW: [facility]                             who is her medical management person that we can send a referral to?  I know there is 1 in [place] ----- Message ----- From: Kayla Jeoffrey RAMAN, FNP Sent: 06/04/2023   4:30 PM EST To: Penne LELON Sharps, MD Subject: RE: [facility]                             Erskin Penne, thank you for reaching out. I do not have a preferred clinic and we have been having quite a bit of trouble getting people in for pain management. We usually just place referral to pain clinic. ----- Message ----- From: Sharps Penne LELON, MD Sent: 06/04/2023   3:51 PM EST To: Jeoffrey RAMAN Kayla, FNP Subject: [facility]                                 Hi Ms. [facility] you are doing well.  I just had a phone visit with Mr. Tiley.  I have been following him for his left-sided brachial plexus tumor.  He like to have this resected in May.  We will go ahead and get him posted for that.  He mentioned that he was interested in more aggressive medical management of his current pain regimen, his gabapentin  is not giving him as much relief as it was previously.  Do you have any preferred medical pain management referrals?  Most of the pain management referrals that we do out of our office are for injection or stimulator therapy.  Let me know and I am happy to either make that referral or if you would be great as well.  Thank so much, Penne

## 2023-06-06 NOTE — Telephone Encounter (Signed)
 Referral faxed to [facility] [address].

## 2023-06-12 NOTE — Telephone Encounter (Signed)
Patient seen on 06/10/2023 and next visit is on 06/22/2023.

## 2023-07-03 ENCOUNTER — Ambulatory Visit: Payer: Medicaid Other | Admitting: Family Medicine

## 2023-07-12 ENCOUNTER — Ambulatory Visit: Payer: Medicaid Other | Admitting: Family Medicine

## 2023-08-10 ENCOUNTER — Telehealth: Payer: Self-pay

## 2023-08-28 NOTE — Telephone Encounter (Signed)
 Sent mychart message to patient on 08/10/23 about scheduling surgery. Patient read mychart message but has not contacted our office to schedule. Will close message and wait to hear from patient.

## 2023-09-18 DIAGNOSIS — M25579 Pain in unspecified ankle and joints of unspecified foot: Secondary | ICD-10-CM | POA: Insufficient documentation

## 2023-09-18 DIAGNOSIS — S8263XA Displaced fracture of lateral malleolus of unspecified fibula, initial encounter for closed fracture: Secondary | ICD-10-CM | POA: Insufficient documentation

## 2023-09-19 ENCOUNTER — Encounter (HOSPITAL_BASED_OUTPATIENT_CLINIC_OR_DEPARTMENT_OTHER): Payer: Self-pay | Admitting: Orthopaedic Surgery

## 2023-09-19 ENCOUNTER — Other Ambulatory Visit: Payer: Self-pay | Admitting: Orthopaedic Surgery

## 2023-09-19 ENCOUNTER — Other Ambulatory Visit: Payer: Self-pay

## 2023-09-19 DIAGNOSIS — S8262XA Displaced fracture of lateral malleolus of left fibula, initial encounter for closed fracture: Secondary | ICD-10-CM

## 2023-09-21 ENCOUNTER — Ambulatory Visit
Admission: RE | Admit: 2023-09-21 | Discharge: 2023-09-21 | Disposition: A | Discharge status: Home / Self Care | Source: Ambulatory Visit | Attending: Orthopaedic Surgery | Admitting: Orthopaedic Surgery

## 2023-09-21 DIAGNOSIS — S8262XA Displaced fracture of lateral malleolus of left fibula, initial encounter for closed fracture: Secondary | ICD-10-CM

## 2023-09-22 NOTE — Discharge Instructions (Signed)
 Netta Cedars, MD [facility]  Please read the following information regarding your care after surgery.  Medications  You only need a prescription for the narcotic pain medicine (ex. oxycodone, Percocet, Norco).  All of the other medicines listed below are available over the counter. ? Aleve 2 pills twice a day for the first 3 days after surgery. ? acetominophen (Tylenol) 650 mg every 4-6 hours as you need for minor to moderate pain ? oxycodone as prescribed for severe pain  ? To help prevent blood clots, take aspirin (81 mg) twice daily for at least 42 days after surgery.  You should also get up every hour while you are awake to move around.  Weight Bearing ? Do NOT bear any weight on the operated leg or foot. This means do NOT touch your surgical leg to the ground!  Cast / Splint / Dressing ? If you have a splint, do NOT remove this. Keep your splint, cast or dressing clean and dry.  Don't put anything (coat hanger, pencil, etc) down inside of it.  If it gets wet, call the office immediately to schedule an appointment for a cast change.  Swelling IMPORTANT: It is normal for you to have swelling where you had surgery. To reduce swelling and pain, keep at least 3 pillows under your leg so that your toes are above your nose and your heel is above the level of your hip.  It may be necessary to keep your foot or leg elevated for several weeks.  This is critical to helping your incisions heal and your pain to feel better.  Follow Up Call my office at [phone] when you are discharged from the hospital or surgery center to schedule an appointment to be seen 7-10 days after surgery.  Call my office at [phone] if you develop a fever >101.5 F, nausea, vomiting, bleeding from the surgical site or severe pain.     Post Anesthesia Home Care Instructions  Activity: Get plenty of rest for the remainder of the day. A responsible individual must stay with you for 24 hours following the  procedure.  For the next 24 hours, DO NOT: -Drive a car -Advertising copywriter -Drink alcoholic beverages -Take any medication unless instructed by your physician -Make any legal decisions or sign important papers.  Meals: Start with liquid foods such as gelatin or soup. Progress to regular foods as tolerated. Avoid greasy, spicy, heavy foods. If nausea and/or vomiting occur, drink only clear liquids until the nausea and/or vomiting subsides. Call your physician if vomiting continues.  Special Instructions/Symptoms: Your throat may feel dry or sore from the anesthesia or the breathing tube placed in your throat during surgery. If this causes discomfort, gargle with warm salt water. The discomfort should disappear within 24 hours.  If you had a scopolamine patch placed behind your ear for the management of post- operative nausea and/or vomiting:  1. The medication in the patch is effective for 72 hours, after which it should be removed.  Wrap patch in a tissue and discard in the trash. Wash hands thoroughly with soap and water. 2. You may remove the patch earlier than 72 hours if you experience unpleasant side effects which may include dry mouth, dizziness or visual disturbances. 3. Avoid touching the patch. Wash your hands with soap and water after contact with the patch.  Regional Anesthesia Blocks  1. You may not be able to move or feel the "blocked" extremity after a regional anesthetic block. This may last may last  from 3-48 hours after placement, but it will go away. The length of time depends on the medication injected and your individual response to the medication. As the nerves start to wake up, you may experience tingling as the movement and feeling returns to your extremity. If the numbness and inability to move your extremity has not gone away after 48 hours, please call your surgeon.   2. The extremity that is blocked will need to be protected until the numbness is gone and the  strength has returned. Because you cannot feel it, you will need to take extra care to avoid injury. Because it may be weak, you may have difficulty moving it or using it. You may not know what position it is in without looking at it while the block is in effect.  3. For blocks in the legs and feet, returning to weight bearing and walking needs to be done carefully. You will need to wait until the numbness is entirely gone and the strength has returned. You should be able to move your leg and foot normally before you try and bear weight or walk. You will need someone to be with you when you first try to ensure you do not fall and possibly risk injury.  4. Bruising and tenderness at the needle site are common side effects and will resolve in a few days.  5. Persistent numbness or new problems with movement should be communicated to the surgeon or the [facility] [phone] [facility] [phone]).

## 2023-09-22 NOTE — H&P (Signed)
 ORTHOPAEDIC SURGERY H&P  Subjective:  The patient presents with left ankle fracture.   Past Medical History:  Diagnosis Date   Nerve sheath tumor    left neck    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/2009   L-tumor biopsy  2012   tumor     tumor removed from neck in 2009     (Not in an outpatient encounter)    No Known Allergies  Social History   Socioeconomic History   Marital status: Single    Spouse name: Not on file   Number of children: Not on file   Years of education: Not on file   Highest education level: Not on file  Occupational History   Not on file  Tobacco Use   Smoking status: Every Day    Current packs/day: 0.50    Average packs/day: 0.5 packs/day for 15.0 years (7.5 ttl pk-yrs)    Types: Cigarettes   Smokeless tobacco: Never  Substance and Sexual Activity   Alcohol use: Yes    Alcohol/week: 2.0 standard drinks of alcohol    Types: 2 Shots of liquor per week    Comment: beer occasional    Drug use: No   Sexual activity: Yes  Other Topics Concern   Not on file  Social History Narrative   Not on file   Social Drivers of Health   Financial Resource Strain: Patient Declined (02/09/2023)   Overall Financial Resource Strain (CARDIA)    Difficulty of Paying Living Expenses: Patient declined  Food Insecurity: Patient Declined (02/09/2023)   Hunger Vital Sign    Worried About Running Out of Food in the Last Year: Patient declined    Ran Out of Food in the Last Year: Patient declined  Transportation Needs: Patient Declined (02/09/2023)   PRAPARE - Administrator, Civil Service (Medical): Patient declined    Lack of Transportation (Non-Medical): Patient declined  Physical Activity: Unknown (02/09/2023)   Exercise Vital Sign    Days of Exercise per Week: 6 days    Minutes of Exercise per Session: Patient declined  Stress: No Stress Concern Present (02/09/2023)   [employer] of  Occupational Health - Occupational Stress Questionnaire    Feeling of Stress : Not at all  Social Connections: Unknown (02/09/2023)   Social Connection and Isolation Panel [NHANES]    Frequency of Communication with Friends and Family: Patient declined    Frequency of Social Gatherings with Friends and Family: Patient declined    Attends Religious Services: Patient declined    Database administrator or Organizations: Patient declined    Attends Banker Meetings: Not on file    Marital Status: Patient declined  Intimate Partner Violence: Not on file     History reviewed. No pertinent family history.   Review of Systems Pertinent items are noted in HPI.  Objective: Vital signs in last 24 hours:    09/19/2023   11:24 AM 03/26/2023    3:10 PM 02/12/2023   12:14 PM  Vitals with BMI  Height 5\' 11"  5\' 11"  5\' 11"   Weight 205 lbs 201 lbs 205 lbs  BMI 28.6 28.05 28.6  Systolic  114 120  Diastolic  76 60  Pulse   78      EXAM: General: Well nourished, well developed. Awake, alert and oriented to time, place, person. Normal mood and affect. No apparent distress. Breathing room air.  Operative Lower Extremity: Alignment -  Neutral Deformity - None Skin intact Tenderness to palpation - left ankle 5/5 TA, PT, GS, Per, EHL, FHL Sensation intact to light touch throughout Palpable DP and PT pulses Special testing: None  The contralateral foot/ankle was examined for comparison and noted to be neurovascularly intact with no localized deformity, swelling, or tenderness.  Imaging Review All images taken were independently reviewed by me.  Assessment/Plan: The clinical and radiographic findings were reviewed and discussed at length with the patient.  The patient has left ankle fracture.  We spoke at length about the natural course of these findings. We discussed nonoperative and operative treatment options in detail.  The risks and benefits were presented and  reviewed. The risks due to hardware failure/irritation, new/persistent/recurrent infection, stiffness, nerve/vessel/tendon injury, nonunion/malunion of any fracture, wound healing issues, allograft usage, development of arthritis, failure of this surgery, possibility of external fixation in certain situations, possibility of delayed definitive surgery, need for further surgery, prolonged wound care including further soft tissue coverage procedures, thromboembolic events, anesthesia/medical complications/events perioperatively and beyond, amputation, death among others were discussed. The patient acknowledged the explanation and agreed to proceed with the plan.  Ali Ink  Orthopaedic Surgery [facility]

## 2023-09-26 ENCOUNTER — Ambulatory Visit (HOSPITAL_BASED_OUTPATIENT_CLINIC_OR_DEPARTMENT_OTHER)
Admission: RE | Admit: 2023-09-26 | Discharge: 2023-09-26 | Disposition: A | Discharge status: Home / Self Care | Attending: Orthopaedic Surgery | Admitting: Orthopaedic Surgery

## 2023-09-26 ENCOUNTER — Other Ambulatory Visit: Payer: Self-pay

## 2023-09-26 ENCOUNTER — Encounter (HOSPITAL_BASED_OUTPATIENT_CLINIC_OR_DEPARTMENT_OTHER)
Admission: RE | Disposition: A | Discharge status: Home / Self Care | Payer: Self-pay | Source: Home / Self Care | Attending: Orthopaedic Surgery

## 2023-09-26 ENCOUNTER — Ambulatory Visit (HOSPITAL_COMMUNITY)

## 2023-09-26 ENCOUNTER — Ambulatory Visit (HOSPITAL_BASED_OUTPATIENT_CLINIC_OR_DEPARTMENT_OTHER): Payer: Self-pay | Admitting: Anesthesiology

## 2023-09-26 ENCOUNTER — Encounter (HOSPITAL_BASED_OUTPATIENT_CLINIC_OR_DEPARTMENT_OTHER): Payer: Self-pay | Admitting: Orthopaedic Surgery

## 2023-09-26 DIAGNOSIS — S82862A Displaced Maisonneuve's fracture of left leg, initial encounter for closed fracture: Secondary | ICD-10-CM | POA: Insufficient documentation

## 2023-09-26 DIAGNOSIS — S8262XA Displaced fracture of lateral malleolus of left fibula, initial encounter for closed fracture: Secondary | ICD-10-CM | POA: Insufficient documentation

## 2023-09-26 DIAGNOSIS — S93432A Sprain of tibiofibular ligament of left ankle, initial encounter: Secondary | ICD-10-CM | POA: Diagnosis not present

## 2023-09-26 DIAGNOSIS — X58XXXA Exposure to other specified factors, initial encounter: Secondary | ICD-10-CM | POA: Insufficient documentation

## 2023-09-26 DIAGNOSIS — F17210 Nicotine dependence, cigarettes, uncomplicated: Secondary | ICD-10-CM | POA: Diagnosis not present

## 2023-09-26 HISTORY — PX: ORIF ANKLE FRACTURE: SHX5408

## 2023-09-26 HISTORY — PX: LIGAMENT REPAIR: SHX5444

## 2023-09-26 HISTORY — DX: Neoplasm of unspecified behavior of bone, soft tissue, and skin: D49.2

## 2023-09-26 HISTORY — PX: SYNDESMOSIS REPAIR: SHX5182

## 2023-09-26 SURGERY — OPEN REDUCTION INTERNAL FIXATION (ORIF) ANKLE FRACTURE
Anesthesia: General | Site: Ankle | Laterality: Right

## 2023-09-26 MED ORDER — CEFAZOLIN SODIUM-DEXTROSE 2-4 GM/100ML-% IV SOLN
2.0000 g | INTRAVENOUS | Status: AC
Start: 2023-09-26 — End: 2023-09-26
  Administered 2023-09-26: 2 g via INTRAVENOUS

## 2023-09-26 MED ORDER — FENTANYL CITRATE (PF) 100 MCG/2ML IJ SOLN
INTRAMUSCULAR | Status: AC
  Filled 2023-09-26: qty 2

## 2023-09-26 MED ORDER — MIDAZOLAM HCL 2 MG/2ML IJ SOLN
2.0000 mg | Freq: Once | INTRAMUSCULAR | Status: AC
  Administered 2023-09-26: 2 mg via INTRAVENOUS

## 2023-09-26 MED ORDER — PHENYLEPHRINE HCL (PRESSORS) 10 MG/ML IV SOLN
INTRAVENOUS | Status: DC | PRN
  Administered 2023-09-26 (×2): 80 ug via INTRAVENOUS

## 2023-09-26 MED ORDER — LACTATED RINGERS IV SOLN: INTRAVENOUS | Status: DC | PRN

## 2023-09-26 MED ORDER — VANCOMYCIN HCL 500 MG IV SOLR
INTRAVENOUS | Status: DC | PRN
  Administered 2023-09-26: 500 mg

## 2023-09-26 MED ORDER — ONDANSETRON HCL 4 MG/2ML IJ SOLN: 4.0000 mg | Freq: Once | INTRAMUSCULAR | Status: DC | PRN

## 2023-09-26 MED ORDER — FENTANYL CITRATE (PF) 100 MCG/2ML IJ SOLN: 25.0000 ug | INTRAMUSCULAR | Status: DC | PRN

## 2023-09-26 MED ORDER — PROPOFOL 10 MG/ML IV BOLUS
INTRAVENOUS | Status: DC | PRN
Start: 2023-09-26 — End: 2023-09-26
  Administered 2023-09-26: 200 mg via INTRAVENOUS

## 2023-09-26 MED ORDER — CLONIDINE HCL (ANALGESIA) 100 MCG/ML EP SOLN
EPIDURAL | Status: DC | PRN
  Administered 2023-09-26: 50 ug

## 2023-09-26 MED ORDER — FENTANYL CITRATE (PF) 100 MCG/2ML IJ SOLN
INTRAMUSCULAR | Status: AC
Start: 2023-09-26 — End: ?
  Filled 2023-09-26: qty 2

## 2023-09-26 MED ORDER — ONDANSETRON HCL 4 MG/2ML IJ SOLN
INTRAMUSCULAR | Status: DC | PRN
  Administered 2023-09-26: 4 mg via INTRAVENOUS

## 2023-09-26 MED ORDER — FENTANYL CITRATE (PF) 100 MCG/2ML IJ SOLN
INTRAMUSCULAR | Status: DC | PRN
  Administered 2023-09-26 (×2): 50 ug via INTRAVENOUS

## 2023-09-26 MED ORDER — CEFAZOLIN SODIUM-DEXTROSE 2-4 GM/100ML-% IV SOLN
INTRAVENOUS | Status: AC
Start: 2023-09-26 — End: ?
  Filled 2023-09-26: qty 100

## 2023-09-26 MED ORDER — LIDOCAINE 2% (20 MG/ML) 5 ML SYRINGE
INTRAMUSCULAR | Status: DC | PRN
  Administered 2023-09-26: 80 mg via INTRAVENOUS

## 2023-09-26 MED ORDER — DEXAMETHASONE SODIUM PHOSPHATE 10 MG/ML IJ SOLN
INTRAMUSCULAR | Status: DC | PRN
  Administered 2023-09-26: 8 mg via INTRAVENOUS

## 2023-09-26 MED ORDER — BUPIVACAINE-EPINEPHRINE (PF) 0.5% -1:200000 IJ SOLN
INTRAMUSCULAR | Status: DC | PRN
  Administered 2023-09-26: 30 mL via PERINEURAL
  Administered 2023-09-26: 10 mL via PERINEURAL

## 2023-09-26 MED ORDER — MIDAZOLAM HCL 2 MG/2ML IJ SOLN
INTRAMUSCULAR | Status: AC
Start: 2023-09-26 — End: ?
  Filled 2023-09-26: qty 2

## 2023-09-26 MED ORDER — MIDAZOLAM HCL 5 MG/5ML IJ SOLN
INTRAMUSCULAR | Status: DC | PRN
  Administered 2023-09-26: 2 mg via INTRAVENOUS

## 2023-09-26 MED ORDER — KETOROLAC TROMETHAMINE 30 MG/ML IJ SOLN
INTRAMUSCULAR | Status: DC | PRN
  Administered 2023-09-26: 30 mg via INTRAVENOUS

## 2023-09-26 MED ORDER — CHLORHEXIDINE GLUCONATE 4 % EX SOLN: 60.0000 mL | Freq: Once | CUTANEOUS | Status: DC

## 2023-09-26 MED ORDER — LACTATED RINGERS IV SOLN: INTRAVENOUS | Status: DC

## 2023-09-26 MED ORDER — ACETAMINOPHEN 500 MG PO TABS
ORAL_TABLET | ORAL | Status: AC
  Filled 2023-09-26: qty 2

## 2023-09-26 MED ORDER — AMISULPRIDE (ANTIEMETIC) 5 MG/2ML IV SOLN: 10.0000 mg | Freq: Once | INTRAVENOUS | Status: DC | PRN

## 2023-09-26 MED ORDER — MIDAZOLAM HCL 2 MG/2ML IJ SOLN
INTRAMUSCULAR | Status: AC
  Filled 2023-09-26: qty 2

## 2023-09-26 MED ORDER — GLYCOPYRROLATE 0.2 MG/ML IJ SOLN
INTRAMUSCULAR | Status: DC | PRN
  Administered 2023-09-26: .2 mg via INTRAVENOUS

## 2023-09-26 MED ORDER — FENTANYL CITRATE (PF) 100 MCG/2ML IJ SOLN
100.0000 ug | Freq: Once | INTRAMUSCULAR | Status: AC
  Administered 2023-09-26: 50 ug via INTRAVENOUS

## 2023-09-26 MED ORDER — ACETAMINOPHEN 500 MG PO TABS
1000.0000 mg | ORAL_TABLET | Freq: Once | ORAL | Status: AC
  Administered 2023-09-26: 1000 mg via ORAL

## 2023-09-26 SURGICAL SUPPLY — 62 items
BANDAGE ESMARK 6X9 LF (GAUZE/BANDAGES/DRESSINGS) IMPLANT
BIT DRILL 2.5X2.75 QC CALB (BIT) IMPLANT
BIT DRILL 2.9X70 QC CALB (BIT) IMPLANT
BLADE SURG 15 STRL LF DISP TIS (BLADE) ×8 IMPLANT
BNDG COHESIVE 4X5 TAN STRL LF (GAUZE/BANDAGES/DRESSINGS) IMPLANT
BNDG ELASTIC 6X10 VLCR STRL LF (GAUZE/BANDAGES/DRESSINGS) ×2 IMPLANT
BNDG GAUZE DERMACEA FLUFF 4 (GAUZE/BANDAGES/DRESSINGS) ×2 IMPLANT
CANISTER SUCT 1200ML W/VALVE (MISCELLANEOUS) IMPLANT
CHLORAPREP W/TINT 26 (MISCELLANEOUS) ×2 IMPLANT
COVER BACK TABLE 60X90IN (DRAPES) ×2 IMPLANT
CUFF TRNQT CYL 34X4.125X (TOURNIQUET CUFF) ×2 IMPLANT
DRAPE C-ARM 42X72 X-RAY (DRAPES) ×2 IMPLANT
DRAPE C-ARMOR (DRAPES) ×2 IMPLANT
DRAPE EXTREMITY T 121X128X90 (DISPOSABLE) ×2 IMPLANT
DRAPE IMP U-DRAPE 54X76 (DRAPES) ×2 IMPLANT
DRAPE U-SHAPE 47X51 STRL (DRAPES) ×2 IMPLANT
DRSG MEPITEL 4X7.2 (GAUZE/BANDAGES/DRESSINGS) ×2 IMPLANT
ELECTRODE REM PT RTRN 9FT ADLT (ELECTROSURGICAL) ×2 IMPLANT
GAUZE PAD ABD 8X10 STRL (GAUZE/BANDAGES/DRESSINGS) ×2 IMPLANT
GAUZE SPONGE 4X4 12PLY STRL (GAUZE/BANDAGES/DRESSINGS) ×2 IMPLANT
GLOVE BIOGEL PI IND STRL 8 (GLOVE) ×2 IMPLANT
GLOVE SURG SS PI 7.5 STRL IVOR (GLOVE) ×4 IMPLANT
GOWN STRL REUS W/ TWL LRG LVL3 (GOWN DISPOSABLE) ×4 IMPLANT
KWIRE ACE 1.6X6 (WIRE) IMPLANT
MARKER SKIN DUAL TIP RULER LAB (MISCELLANEOUS) IMPLANT
NDL HYPO 25X1 1.5 SAFETY (NEEDLE) IMPLANT
NEEDLE HYPO 25X1 1.5 SAFETY (NEEDLE) IMPLANT
NS IRRIG 1000ML POUR BTL (IV SOLUTION) ×2 IMPLANT
PACK BASIN DAY SURGERY FS (CUSTOM PROCEDURE TRAY) ×2 IMPLANT
PAD CAST 4YDX4 CTTN HI CHSV (CAST SUPPLIES) ×2 IMPLANT
PADDING CAST ABS COTTON 4X4 ST (CAST SUPPLIES) IMPLANT
PADDING CAST SYNTHETIC 4X4 STR (CAST SUPPLIES) ×6 IMPLANT
PADDING CAST SYNTHETIC 6X4 NS (CAST SUPPLIES) ×4 IMPLANT
PENCIL SMOKE EVACUATOR (MISCELLANEOUS) ×2 IMPLANT
PLATE LOCK 7H 92 BILAT FIB (Plate) IMPLANT
SCREW LOCK 3.5X16 DIST TIB (Screw) IMPLANT
SCREW LOCK CORT STAR 3.5X16 (Screw) IMPLANT
SCREW NLOCK CANC HEX 4X50 (Screw) IMPLANT
SCREW NLOCK CANC HEX 4X55 (Screw) IMPLANT
SCREW NLOCK DIST FEM 4X65 (Screw) IMPLANT
SCREW NLOCK T15 FT 30X4XST TIP (Screw) ×2 IMPLANT
SCREW NON LOCKING LP 3.5 16MM (Screw) IMPLANT
SHEET MEDIUM DRAPE 40X70 STRL (DRAPES) ×2 IMPLANT
SLEEVE SCD COMPRESS KNEE MED (STOCKING) ×2 IMPLANT
SPIKE FLUID TRANSFER (MISCELLANEOUS) IMPLANT
SPLINT FIBERGLASS 4X30 (CAST SUPPLIES) ×4 IMPLANT
SPONGE T-LAP 18X18 ~~LOC~~+RFID (SPONGE) ×2 IMPLANT
STAPLER SKIN PROX WIDE 3.9 (STAPLE) ×2 IMPLANT
STOCKINETTE 6 STRL (DRAPES) IMPLANT
STOCKINETTE ORTHO 6X25 (MISCELLANEOUS) ×2 IMPLANT
SUCTION TUBE FRAZIER 10FR DISP (SUCTIONS) IMPLANT
SUT ETHILON 2 0 FS 18 (SUTURE) ×4 IMPLANT
SUT MNCRL AB 3-0 PS2 18 (SUTURE) IMPLANT
SUT PDS II 3-0 CT2 27 ABS (SUTURE) IMPLANT
SUT VIC AB 0 CT1 27XBRD ANBCTR (SUTURE) IMPLANT
SUT VIC AB 2-0 CT1 TAPERPNT 27 (SUTURE) ×2 IMPLANT
SUT VIC AB 3-0 SH 27X BRD (SUTURE) IMPLANT
SYR BULB IRRIG 60ML STRL (SYRINGE) ×2 IMPLANT
SYR CONTROL 10ML LL (SYRINGE) IMPLANT
TOWEL GREEN STERILE FF (TOWEL DISPOSABLE) ×4 IMPLANT
TUBE CONNECTING 20X1/4 (TUBING) IMPLANT
UNDERPAD 30X36 HEAVY ABSORB (UNDERPADS AND DIAPERS) ×2 IMPLANT

## 2023-09-26 NOTE — Transfer of Care (Signed)
 Immediate Anesthesia Transfer of Care Note  Patient: Charles Baker  Procedure(s) Performed: OPEN REDUCTION INTERNAL FIXATION (ORIF) ANKLE FRACTURE (Left: Ankle) REPAIR, SYNDESMOSIS, ANKLE (Left: Ankle) REPAIR, LIGAMENT (Right: Ankle)  Patient Location: PACU  Anesthesia Type:GA combined with regional for post-op pain  Level of Consciousness: drowsy and patient cooperative  Airway & Oxygen Therapy: Patient Spontanous Breathing and Patient connected to face mask oxygen  Post-op Assessment: Report given to RN and Post -op Vital signs reviewed and stable  Post vital signs: Reviewed and stable  Last Vitals:  Vitals Value Taken Time  BP 111/77 09/26/23 1349  Temp 36.2 C 09/26/23 1349  Pulse 71 09/26/23 1355  Resp 14 09/26/23 1355  SpO2 99 % 09/26/23 1355  Vitals shown include unfiled device data.  Last Pain:  Vitals:   09/26/23 1349  TempSrc:   PainSc: Asleep      Patients Stated Pain Goal: 3 (09/26/23 1058)  Complications: No notable events documented.

## 2023-09-26 NOTE — Progress Notes (Signed)
Assisted Dr. Gifford Shave with left, adductor canal, popliteal, ultrasound guided block. Side rails up, monitors on throughout procedure. See vital signs in flow sheet. Tolerated Procedure well.

## 2023-09-26 NOTE — Op Note (Addendum)
 09/26/2023  3:38 PM   PATIENT: Charles Baker  53 y.o. male  MRN: [id number]   PRE-OPERATIVE DIAGNOSIS:   Closed displaced fracture of lateral malleolus of left fibula with Maisonneuve injury  POST-OPERATIVE DIAGNOSIS:   Same   PROCEDURE: 1] Left ankle distal fibula ORIF 2] Left ankle syndesmosis ORIF 3] Left proximal fibula closed reduction and splinting   SURGEON:  Ali Ink, MD   ASSISTANT: None   ANESTHESIA: General, regional   EBL: Minimal   TOURNIQUET:    Total Tourniquet Time Documented: Thigh (Left) - 33 minutes Total: Thigh (Left) - 33 minutes    COMPLICATIONS: None apparent   DISPOSITION: Extubated, awake and stable to recovery.   INDICATION FOR PROCEDURE: The patient presented with above diagnosis.  We discussed the diagnosis, alternative treatment options, risks and benefits of the above surgical intervention, as well as alternative non-operative treatments. All questions/concerns were addressed and the patient/family demonstrated appropriate understanding of the diagnosis, the procedure, the postoperative course, and overall prognosis. The patient wished to proceed with surgical intervention and signed an informed surgical consent as such, in each others presence prior to surgery.   PROCEDURE IN DETAIL: After preoperative consent was obtained and the correct operative site was identified, the patient was brought to the operating room supine on stretcher and transferred onto operating table. General anesthesia was induced. Preoperative antibiotics were administered. Surgical timeout was taken. The patient was then positioned supine with an ipsilateral hip bump. The operative lower extremity was prepped and draped in standard sterile fashion with a tourniquet around the thigh. The extremity was exsanguinated and the tourniquet was inflated to 275 mmHg.  A standard lateral incision was made over the distal fibula. Dissection was carried  down to the level of the fibula and the fracture site identified. The superficial peroneal nerve was identified and protected throughout the procedure. The fibula was noted to be shortened with interposed periosteum. The fibula was brought out to length. The fibula fracture was debrided and the edges defined to achieve cortical read. Reduction maneuver was performed using pointed reduction forceps and lobster forceps. In this manner, the fibula length was restored and fracture reduced. A lag screw was not placed given the orientation of fracture lines and comminution. Due to poor bone quality and extensive comminution at the fracture site, it was decided to use a locking distal fibula plate. We then selected a Zimmer locking plate to match the anatomy of the distal fibula and placed it laterally. This was implanted under intraoperative fluoroscopy with a combination of distal locking screws and proximal cortical & locking screws.  A manual external rotation stress radiograph was obtained and demonstrated widening of the ankle mortise. Given this intraoperative finding as well as preoperative subluxation, it was decided to reduce and fix the syndesmosis. Therefore a nonlocking quadricortical hex head 4.0 mm screw was implanted through the fibula plate in appropriate fashion to fix the syndesmosis. Screw position was verified along anteromedial tibial cortex by fluoroscopy. A repeat stress radiograph showed complete stability of the ankle mortise to testing. Two further screws were implanted to reinforce the syndesmosis fixation due to delayed presentation and degree of instability preop.  The surgical sites were thoroughly irrigated. The tourniquet was deflated and hemostasis achieved. Betadine and vancomycin  powder were applied. The deep layers were closed using 2-0 vicryl. The skin was closed without tension.   Closed reduction and splinting of the proximal Maissoneuve fibula fracture was also performed.    The leg was cleaned  with saline and sterile dressings with gauze were applied. A well padded bulky short leg splint was applied. The patient was awakened from anesthesia and transported to the recovery room in stable condition.    FOLLOW UP PLAN: -transfer to PACU, then home -strict NWB operative extremity, maximum elevation -maintain short leg splint until follow up -DVT ppx: Aspirin 81 mg twice daily while NWB -follow up as outpatient within 7-10 days for wound check with exchange of short leg splint to short leg cast -sutures out in 2-3 weeks in outpatient office   RADIOGRAPHS: AP, lateral, oblique and stress radiographs of the left ankle were obtained intraoperatively. These showed interval reduction and fixation of the fractures. Manual stress radiographs were taken and the joints were noted to be stable following fixation. All hardware is appropriately positioned and of the appropriate lengths. No other acute injuries are noted.   Ali Ink Orthopaedic Surgery [facility]

## 2023-09-26 NOTE — H&P (Signed)

## 2023-09-26 NOTE — Anesthesia Procedure Notes (Signed)
 Anesthesia Regional Block: Popliteal block   Pre-Anesthetic Checklist: , timeout performed,  Correct Patient, Correct Site, Correct Laterality,  Correct Procedure, Correct Position, site marked,  Risks and benefits discussed,  Surgical consent,  Pre-op evaluation,  At surgeon's request and post-op pain management  Laterality: Left  Prep: chloraprep       Needles:  Injection technique: Single-shot  Needle Type: Echogenic Needle     Needle Length: 9cm  Needle Gauge: 21     Additional Needles:   Procedures:,,,, ultrasound used (permanent image in chart),,    Narrative:  Start time: 09/26/2023 11:15 AM End time: 09/26/2023 11:22 AM Injection made incrementally with aspirations every 5 mL.  Performed by: Personally  Anesthesiologist: Erin Havers, MD  Additional Notes: No pain on injection. No increased resistance to injection. Injection made in 5cc increments.  Good needle visualization.  Patient tolerated procedure well.

## 2023-09-26 NOTE — Anesthesia Postprocedure Evaluation (Signed)
 Anesthesia Post Note  Patient: Charles Baker  Procedure(s) Performed: OPEN REDUCTION INTERNAL FIXATION (ORIF) ANKLE FRACTURE (Left: Ankle) REPAIR, SYNDESMOSIS, ANKLE (Left: Ankle) REPAIR, LIGAMENT (Right: Ankle)     Patient location during evaluation: PACU Anesthesia Type: General Level of consciousness: awake and alert Pain management: pain level controlled Vital Signs Assessment: post-procedure vital signs reviewed and stable Respiratory status: spontaneous breathing, nonlabored ventilation and respiratory function stable Cardiovascular status: blood pressure returned to baseline and stable Postop Assessment: no apparent nausea or vomiting Anesthetic complications: no   No notable events documented.  Last Vitals:  Vitals:   09/26/23 1411 09/26/23 1421  BP: 114/89 (!) 109/97  Pulse: 73 81  Resp: 10 16  Temp:  (!) 36.1 C  SpO2: 94% 96%    Last Pain:  Vitals:   09/26/23 1421  TempSrc: Temporal  PainSc: 0-No pain                 Erin Havers

## 2023-09-26 NOTE — Anesthesia Procedure Notes (Signed)
 Procedure Name: LMA Insertion Date/Time: 09/26/2023 12:56 PM  Performed by: Darcel Early, CRNAPre-anesthesia Checklist: Patient identified, Emergency Drugs available, Suction available, Patient being monitored and Timeout performed Patient Re-evaluated:Patient Re-evaluated prior to induction Oxygen Delivery Method: Circle system utilized Preoxygenation: Pre-oxygenation with 100% oxygen Induction Type: IV induction Ventilation: Mask ventilation without difficulty LMA: LMA inserted LMA Size: 5.0 Number of attempts: 1 Placement Confirmation: positive ETCO2 Dental Injury: Teeth and Oropharynx as per pre-operative assessment

## 2023-09-26 NOTE — Anesthesia Preprocedure Evaluation (Addendum)
 Anesthesia Evaluation  Patient identified by MRN, date of birth, ID band Patient awake    Reviewed: Allergy & Precautions, NPO status , Patient's Chart, lab work & pertinent test results  Airway Mallampati: II  TM Distance: >3 FB Neck ROM: Full    Dental  (+) Dental Advisory Given, Missing   Pulmonary Current Smoker and Patient abstained from smoking.   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  Reproductive/Obstetrics                             Anesthesia Physical Anesthesia Plan  ASA: 2  Anesthesia Plan: General   Post-op Pain Management: Tylenol  PO (pre-op)* and Regional block*   Induction: Intravenous  PONV Risk Score and Plan: 2 and Midazolam, Dexamethasone and Ondansetron  Airway Management Planned: LMA  Additional Equipment:   Intra-op Plan:   Post-operative Plan: Extubation in OR  Informed Consent: I have reviewed the patients History and Physical, chart, labs and discussed the procedure including the risks, benefits and alternatives for the proposed anesthesia with the patient or authorized representative who has indicated his/her understanding and acceptance.     Dental advisory given  Plan Discussed with: CRNA  Anesthesia Plan Comments:        Anesthesia Quick Evaluation

## 2023-09-26 NOTE — Anesthesia Procedure Notes (Signed)
 Anesthesia Regional Block: Adductor canal block   Pre-Anesthetic Checklist: , timeout performed,  Correct Patient, Correct Site, Correct Laterality,  Correct Procedure, Correct Position, site marked,  Risks and benefits discussed,  Surgical consent,  Pre-op evaluation,  At surgeon's request and post-op pain management  Laterality: Left  Prep: chloraprep       Needles:  Injection technique: Single-shot  Needle Type: Echogenic Needle     Needle Length: 9cm  Needle Gauge: 21     Additional Needles:   Procedures:,,,, ultrasound used (permanent image in chart),,    Narrative:  Start time: 09/26/2023 11:22 AM End time: 09/26/2023 11:27 AM Injection made incrementally with aspirations every 5 mL.  Performed by: Personally  Anesthesiologist: Erin Havers, MD  Additional Notes: No pain on injection. No increased resistance to injection. Injection made in 5cc increments.  Good needle visualization.  Patient tolerated procedure well.

## 2023-09-27 ENCOUNTER — Encounter (HOSPITAL_BASED_OUTPATIENT_CLINIC_OR_DEPARTMENT_OTHER): Payer: Self-pay | Admitting: Orthopaedic Surgery

## 2023-10-02 ENCOUNTER — Other Ambulatory Visit (HOSPITAL_COMMUNITY): Payer: Self-pay | Admitting: Medical

## 2023-10-02 ENCOUNTER — Ambulatory Visit (INDEPENDENT_AMBULATORY_CARE_PROVIDER_SITE_OTHER): Admitting: Student-PharmD

## 2023-10-02 ENCOUNTER — Other Ambulatory Visit (HOSPITAL_COMMUNITY): Payer: Self-pay

## 2023-10-02 ENCOUNTER — Encounter: Payer: Self-pay | Admitting: Student-PharmD

## 2023-10-02 ENCOUNTER — Telehealth: Payer: Self-pay

## 2023-10-02 ENCOUNTER — Ambulatory Visit (HOSPITAL_COMMUNITY)
Admission: RE | Admit: 2023-10-02 | Discharge: 2023-10-02 | Disposition: A | Discharge status: Home / Self Care | Source: Ambulatory Visit | Attending: Medical | Admitting: Medical

## 2023-10-02 VITALS — BP 126/85 | HR 76

## 2023-10-02 DIAGNOSIS — I82452 Acute embolism and thrombosis of left peroneal vein: Secondary | ICD-10-CM | POA: Diagnosis not present

## 2023-10-02 DIAGNOSIS — M7989 Other specified soft tissue disorders: Secondary | ICD-10-CM

## 2023-10-02 DIAGNOSIS — M79669 Pain in unspecified lower leg: Secondary | ICD-10-CM | POA: Diagnosis present

## 2023-10-02 DIAGNOSIS — M79604 Pain in right leg: Secondary | ICD-10-CM | POA: Insufficient documentation

## 2023-10-02 MED ORDER — APIXABAN (ELIQUIS) VTE STARTER PACK (10MG AND 5MG)
ORAL_TABLET | ORAL | 0 refills | Status: DC
Start: 2023-10-02 — End: 2023-11-15
  Filled 2023-10-02: qty 74, 30d supply, fill #0

## 2023-10-02 MED ORDER — APIXABAN 5 MG PO TABS
5.0000 mg | ORAL_TABLET | Freq: Two times a day (BID) | ORAL | 1 refills | Status: DC
Start: 2023-10-02 — End: 2024-01-02

## 2023-10-02 NOTE — Patient Instructions (Addendum)
-  Stop taking aspirin.  -Start apixaban (Eliquis) 10 mg twice daily for 7 days followed by 5 mg twice daily. -Your refills have [facility] to your [facility]. You may need to call the pharmacy to ask them to fill this when you start to run low on your current supply.  -It is important to take your medication around the same time every day.  -Avoid NSAIDs like ibuprofen  (Advil , Motrin ) and naproxen (Aleve) as well as aspirin doses over 100 mg daily. -Tylenol  (acetaminophen ) is the preferred over the counter pain medication to lower the risk of bleeding. -Be sure to alert all of your health care providers that you are taking an anticoagulant prior to starting a new medication or having a procedure. -Monitor for signs and symptoms of bleeding (abnormal bruising, prolonged bleeding, nose bleeds, bleeding from gums, discolored urine, black tarry stools). If you have fallen and hit your head OR if your bleeding is severe or not stopping, seek emergency care.  -Go to the emergency room if emergent signs and symptoms of new clot occur (new or worse swelling and pain in an arm or leg, shortness of breath, chest pain, fast or irregular heartbeats, lightheadedness, dizziness, fainting, coughing up blood) or if you experience a significant color change (pale or blue) in the extremity that has the DVT.  -We recommend you wear compression stockings (20-30 mmHg) as long as you are having swelling or pain. Be sure to purchase the correct size and take them off at night.   If you have any questions or need to reschedule an appointment, please call [phone]. If you are having an emergency, call 911 or present to the nearest emergency room.   What is a DVT?  -Deep vein thrombosis (DVT) is a condition in which a blood clot forms in a vein of the deep venous system which can occur in the lower leg, thigh, pelvis, arm, or neck. This condition is serious and can be life-threatening if the clot travels to the arteries of the  lungs and causing a blockage (pulmonary embolism, PE). A DVT can also damage veins in the leg, which can lead to long-term venous disease, leg pain, swelling, discoloration, and ulcers or sores (post-thrombotic syndrome).  -Treatment may include taking an anticoagulant medication to prevent more clots from forming and the current clot from growing, wearing compression stockings, and/or surgical procedures to remove or dissolve the clot.

## 2023-10-02 NOTE — Progress Notes (Signed)
 DVT Clinic Note  Name: Charles Baker     MRN: [id number]     DOB: November 30, 1970     Sex: male  PCP: Jenelle Mis, FNP  Today's Visit: Visit Information: Initial Visit  Referred to DVT Clinic by: Orthopedic Surgery - Hadassah Letters, PA-C ([facility]) Referred to CPP by: Dr. Edgardo Goodwill Reason for referral:  Chief Complaint  Patient presents with   DVT   HISTORY OF PRESENT ILLNESS: Charles Baker is a 53 y.o. 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 On 08/02/23 patient was diagnosed with a left ankle fracture at an outside ED then underwent ORIF left ankle by Dr. Cherl Corner on 09/26/23 and was discharged with aspirin 81 mg BID for DVT prophylaxis. DVT study today showed acute DVT in the left peroneal and posterior tibial veins and he was referred to DVT Clinic to start treatment. He arrives in a wheelchair and accompanied by his mother. Patient denies history of DVT. Has been less mobile since his surgery. His left lower leg was in a splint but was moved into a walking boot today at ortho. Currently smoking 1/2 ppd and not currently interested in quitting. Has not had labs checked since 2023.   Positive Thrombotic Risk Factors: Recent surgery (within 3 months), Recent trauma (within 3 months), Paralysis, paresis, or recent plaster cast immobilization of lower extremity, Bed rest >72 hours within 3 month, Smoking Bleeding Risk Factors: None Present  Negative Thrombotic Risk Factors: Previous VTE, Recent admission to hospital with acute illness (within 3 months), Central venous catheterization, Sedentary journey lasting >8 hours within 4 weeks, Pregnancy, Within 6 weeks postpartum, Recent cesarean section (within 3 months), Estrogen therapy, Testosterone therapy, Erythropoiesis-stimulating agent, Recent COVID diagnosis (within 3 months), Active cancer, Non-malignant, chronic inflammatory  condition, Known thrombophilic condition, Obesity, Older age  Rx Insurance Coverage: Medicaid Rx Affordability: Eliquis will be $4/month Rx Assistance Provided: None needed at this time Preferred Pharmacy: Starter pack filled at [facility]. [facility]ills [facility] preferred [facility]   Past Medical History:  Diagnosis Date   Nerve sheath tumor    left neck    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/2009   L-tumor biopsy  2012   LIGAMENT REPAIR Right 09/26/2023   Procedure: REPAIR, LIGAMENT;  Surgeon: Ali Ink, MD;  Location: [facility];  Service: Orthopedics;  Laterality: Right;   ORIF ANKLE FRACTURE Left 09/26/2023   Procedure: OPEN REDUCTION INTERNAL FIXATION (ORIF) ANKLE FRACTURE;  Surgeon: Ali Ink, MD;  Location: [facility];  Service: Orthopedics;  Laterality: Left;   SYNDESMOSIS REPAIR Left 09/26/2023   Procedure: REPAIR, SYNDESMOSIS, ANKLE;  Surgeon: Ali Ink, MD;  Location: [facility];  Service: Orthopedics;  Laterality: Left;   tumor     tumor removed from neck in 2009    Social History   Socioeconomic History   Marital status: Single    Spouse name: Not on file   Number of children: Not on file   Years of education: Not on file   Highest education level: Not on file  Occupational History   Not on file  Tobacco Use   Smoking status: Every Day    Current packs/day: 0.50    Average packs/day: 0.5 packs/day for 15.0 years (7.5 ttl pk-yrs)    Types: Cigarettes   Smokeless tobacco: Never  Substance and Sexual Activity  Alcohol use: Yes    Alcohol/week: 2.0 standard drinks of alcohol    Types: 2 Shots of liquor per week    Comment: beer occasional    Drug use: Yes    Types: Marijuana    Comment: last time a few days   Sexual activity: Yes  Other Topics Concern   Not on file  Social History Narrative   Not on file   Social Drivers of Health   Financial  Resource Strain: Patient Declined (02/09/2023)   Overall Financial Resource Strain (CARDIA)    Difficulty of Paying Living Expenses: Patient declined  Food Insecurity: Patient Declined (02/09/2023)   Hunger Vital Sign    Worried About Running Out of Food in the Last Year: Patient declined    Ran Out of Food in the Last Year: Patient declined  Transportation Needs: Patient Declined (02/09/2023)   PRAPARE - Administrator, Civil Service (Medical): Patient declined    Lack of Transportation (Non-Medical): Patient declined  Physical Activity: Unknown (02/09/2023)   Exercise Vital Sign    Days of Exercise per Week: 6 days    Minutes of Exercise per Session: Patient declined  Stress: No Stress Concern Present (02/09/2023)   [employer] of Occupational Health - Occupational Stress Questionnaire    Feeling of Stress : Not at all  Social Connections: Unknown (02/09/2023)   Social Connection and Isolation Panel [NHANES]    Frequency of Communication with Friends and Family: Patient declined    Frequency of Social Gatherings with Friends and Family: Patient declined    Attends Religious Services: Patient declined    Database administrator or Organizations: Patient declined    Attends Engineer, structural: Not on file    Marital Status: Patient declined  Intimate Partner Violence: Not on file    Family History  Problem Relation Age of Onset   COPD Maternal Grandmother    Stroke Paternal Grandfather     Allergies as of 10/02/2023   (No Known Allergies)    Current Outpatient Medications on File Prior to Visit  Medication Sig Dispense Refill   acetaminophen  (TYLENOL ) 325 MG tablet Take 650 mg by mouth every 6 (six) hours as needed for mild pain (pain score 1-3) or moderate pain (pain score 4-6).     oxyCODONE (OXY IR/ROXICODONE) 5 MG immediate release tablet Take 5 mg by mouth every 4 (four) hours as needed for moderate pain (pain score 4-6) or severe pain (pain  score 7-10).     No current facility-administered medications on file prior to visit.   REVIEW OF SYSTEMS:  Review of Systems  Respiratory:  Negative for shortness of breath.   Cardiovascular:  Positive for leg swelling. Negative for chest pain and palpitations.  Musculoskeletal:  Positive for myalgias.  Neurological:  Negative for dizziness and tingling.   PHYSICAL EXAMINATION:  Vitals:   10/02/23 1210  BP: 126/85  Pulse: 76  SpO2: 96%   Physical Exam Vitals reviewed.  Cardiovascular:     Rate and Rhythm: Normal rate.  Pulmonary:     Effort: Pulmonary effort is normal.  Musculoskeletal:        General: Tenderness present.     Right lower leg: No edema.     Left lower leg: Edema (1+) present.  Skin:    Findings: Erythema present. No bruising.  Psychiatric:        Mood and Affect: Mood normal.        Behavior: Behavior normal.  Thought Content: Thought content normal.   Villalta Score for Post-Thrombotic Syndrome: Pain: Mild Cramps: Absent Heaviness: Mild Paresthesia: Absent Pruritus: Mild Pretibial Edema: Mild Skin Induration: Absent Hyperpigmentation: Absent Redness: Mild Venous Ectasia: Absent Pain on calf compression: Mild Villalta Preliminary Score: 6 Is venous ulcer present?: No If venous ulcer is present and score is <15, then 15 points total are assigned: Absent Villalta Total Score: 6  LABS:  CBC     Component Value Date/Time   WBC 7.5 01/20/2022 0852   RBC 4.59 01/20/2022 0852   HGB 15.6 01/20/2022 0852   HCT 43.9 01/20/2022 0852   PLT 176 01/20/2022 0852   MCV 95.6 01/20/2022 0852   MCH 34.0 (H) 01/20/2022 0852   MCHC 35.5 01/20/2022 0852   RDW 12.1 01/20/2022 0852   LYMPHSABS 1,943 01/20/2022 0852   EOSABS 405 01/20/2022 0852   BASOSABS 38 01/20/2022 0852    Hepatic Function      Component Value Date/Time   PROT 7.2 01/20/2022 0852   AST 32 01/20/2022 0852   ALT 30 01/20/2022 0852   BILITOT 0.9 01/20/2022 0852    Renal  Function   Lab Results  Component Value Date   CREATININE 0.95 01/20/2022    CrCl cannot be calculated (Patient's most recent lab result is older than the maximum 21 days allowed.).   VVS Vascular Lab Studies:  10/02/23 VAS US  LOWER EXTREMITY VENOUS BILAT (DVT)  Summary:  RIGHT:  - There is no evidence of deep vein thrombosis in the lower extremity.  - There is no evidence of superficial venous thrombosis.    LEFT:  - Findings consistent with acute deep vein thrombosis involving the left  posterior tibial veins, and left peroneal veins. One of each of the paired PTV and peroneal veins are non-compressible. The other of each pair are partially compressible.  - There is no evidence of superficial venous thrombosis.  - No cystic structure found in the popliteal fossa.   ASSESSMENT: Location of DVT: Left distal vein Cause of DVT: provoked by a transient risk factor  Patient without prior history of DVT diagnosed today with acute DVT in the left posterior tibial and peroneal veins s/p ORIF for left ankle fracture on 09/26/23. Will plan to treat a first provoked DVT with 3 months of anticoagulation pending return to normal mobility. He has not had labs checked since 2023. Will check a baseline CBC and CMP today. No concerns with starting Eliquis while awaiting these results. Counseled patient extensively on Eliquis. Starter pack was filled at our on site pharmacy and refills sent to his preferred pharmacy. He was only taking aspirin for DVT prophylaxis after orthopedic surgery so we will have him discontinue this. No barriers to medication access or adherence identified at this time. Counseled on compression and elevation as able. All questions have been answered at this time.   PLAN: -Discontinue aspirin 81 mg BID.  -Start apixaban (Eliquis) 10 mg twice daily for 7 days followed by 5 mg twice daily. -Expected duration of therapy: 3 months. Therapy started on 10/02/23. -Patient educated on  purpose, proper use and potential adverse effects of apixaban (Eliquis). -Discussed importance of taking medication around the same time every day. -Advised patient of medications to avoid (NSAIDs, aspirin doses >100 mg daily). -Educated that Tylenol  (acetaminophen ) is the preferred analgesic to lower the risk of bleeding. -Advised patient to alert all providers of anticoagulation therapy prior to starting a new medication or having a procedure. -Emphasized importance of monitoring for  signs and symptoms of bleeding (abnormal bruising, prolonged bleeding, nose bleeds, bleeding from gums, discolored urine, black tarry stools). -Educated patient to present to the ED if emergent signs and symptoms of new thrombosis occur. -Counseled patient to wear compression stockings daily, removing at night. Counseled on proper leg elevation to improve swelling.   Follow up: 3 months in DVT Clinic  Faye Hoops, PharmD, [place], CPP Deep Vein Thrombosis Clinic Clinical Pharmacist Practitioner [phone]

## 2023-10-02 NOTE — Telephone Encounter (Signed)
 Jillian from [facility] called to get results after being left a message from our office that pt had positive DVT duplex. Read her the preliminary report and informed her this has not been signed off on yet and it is preliminary. She verbalized understanding-no further questions/concerns at this time.

## 2023-10-10 ENCOUNTER — Encounter (HOSPITAL_BASED_OUTPATIENT_CLINIC_OR_DEPARTMENT_OTHER): Payer: Self-pay | Admitting: Orthopaedic Surgery

## 2023-11-05 ENCOUNTER — Ambulatory Visit: Payer: Self-pay

## 2023-11-05 ENCOUNTER — Telehealth: Payer: Self-pay | Admitting: Student-PharmD

## 2023-11-05 NOTE — Telephone Encounter (Signed)
  FYI Only or Action Required?: FYI only for provider.  Patient was last seen in primary care on 02/12/2023 by Charles Jeoffrey RAMAN, FNP. Called Nurse Triage reporting Arm Pain. Symptoms began several years ago. Interventions attempted: Nothing. Symptoms are: gradually worsening.  Triage Disposition: See PCP When Office is Open (Within 3 Days) Apt tomorrow, 7/8 Patient/caregiver understands and will follow disposition?: Yes   Copied from CRM [id number]. Topic: Clinical - Red Word Triage >> Nov 05, 2023  9:13 AM Turkey B wrote: [facility] that prompted transfer to Nurse Triage: pt has severe pain in left arm every morning when he wakes Reason for Disposition  [1] MODERATE pain (e.g., interferes with normal activities) AND [2] present > 3 days  Answer Assessment - Initial Assessment Questions 1. ONSET: When did the pain start?     States has a tumor in the neck and arm has always bothered him 2. LOCATION: Where is the pain located?     Left arm 3. PAIN: How bad is the pain? (Scale 1-10; or mild, moderate, severe)   - MILD (1-3): Doesn't interfere with normal activities.   - MODERATE (4-7): Interferes with normal activities (e.g., work or school) or awakens from sleep.   - SEVERE (8-10): Excruciating pain, unable to do any normal activities, unable to hold a cup of water.     mild 4. WORK OR EXERCISE: Has there been any recent work or exercise that involved this part of the body?     denies 5. CAUSE: What do you think is causing the arm pain?     denies 6. OTHER SYMPTOMS: Do you have any other symptoms? (e.g., neck pain, swelling, rash, fever, numbness, weakness)     Nerve pain 7. PREGNANCY: Is there any chance you are pregnant? When was your last menstrual period?     na  Protocols used: Arm Pain-A-AH

## 2023-11-05 NOTE — Telephone Encounter (Signed)
 Patient called reporting a new shooting pain in his neck/left arm since around the time he started taking Eliquis  for his new LLE DVT. Describes this as a nerve pain and the location is around where his benign tumor is. He has scheduled an appointment with his PCP this week but wanted to call here to see if we needed to see him for this as well. Discussed with Dr. Serene, would recommend working this up with PCP as this would be a new problem unrelated to his LLE DVT/outside the scope of this clinic and would not expect this to be a side effect of Eliquis . Will defer further workup/management of new pain to PCP. Patient confirmed understanding and will keep appt with PCP this week.

## 2023-11-06 ENCOUNTER — Ambulatory Visit: Admitting: Family Medicine

## 2023-11-13 ENCOUNTER — Ambulatory Visit: Admitting: Family Medicine

## 2023-11-13 ENCOUNTER — Encounter: Payer: Self-pay | Admitting: Family Medicine

## 2023-11-13 VITALS — BP 121/84 | HR 70 | Temp 97.5°F | Ht 73.0 in | Wt 215.4 lb

## 2023-11-13 DIAGNOSIS — E663 Overweight: Secondary | ICD-10-CM | POA: Diagnosis not present

## 2023-11-13 DIAGNOSIS — G540 Brachial plexus disorders: Secondary | ICD-10-CM | POA: Diagnosis not present

## 2023-11-13 DIAGNOSIS — Z125 Encounter for screening for malignant neoplasm of prostate: Secondary | ICD-10-CM

## 2023-11-13 DIAGNOSIS — Z13220 Encounter for screening for lipoid disorders: Secondary | ICD-10-CM

## 2023-11-13 DIAGNOSIS — Z130 Encounter for screening for diseases of the blood and blood-forming organs and certain disorders involving the immune mechanism: Secondary | ICD-10-CM

## 2023-11-13 NOTE — Assessment & Plan Note (Signed)
 Pt established with Neurosurgery, will return for follow up and plan of care. Left arm pain managed with Oxycodone/Hydrocodone per Ortho. PDMP reviewed. No red flags on exam. No further concerns today. Return for CPE

## 2023-11-13 NOTE — Progress Notes (Signed)
 Subjective:  HPI: Charles Baker is a 53 y.o. male presenting on 11/13/2023 for Acute Visit (Sharp shooting pain down arm from neck where tumor is  left side. Due to crutch use pain has increased, also, (FYI)Blood clots in broken leg.  Charles Baker referral back to surgeon Charles Baker  for tumor removal /Needs medication change so to not have reaction )   HPI Patient is in today for left arm pain. He has a known brachial plexus mass followed by neurosurgery with plan for surgical removal. This surgery was delayed due to LLE fracture and DVT, has been placed on Eliquis , remains in an ortho boot and is using crutches. He is also followed by Ortho for this and has also seen pain management in the past. Spoke with Ortho yesterday and was prescribed Hydrocodone, he is concerned today about interactions with his Eliquis . Would also like referral to Neurosurg and have MRI updated to proceed with planning brachial plexus surgery. Requests blood type check with physical labs. Pain is described as shooting pain down his left arm, this is not unchanged from his baseline pain however is exacerbated by the use of crutches. He denies worsening weakness or sensory changes.   Review of Systems  All other systems reviewed and are negative.   Relevant past medical history reviewed and updated as indicated.   Past Medical History:  Diagnosis Date   Nerve sheath tumor    left neck     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/2009   L-tumor biopsy  2012   LIGAMENT REPAIR Right 09/26/2023   Procedure: REPAIR, LIGAMENT;  Surgeon: Charles Drape, MD;  Location: [facility];  Service: Orthopedics;  Laterality: Right;   ORIF ANKLE FRACTURE Left 09/26/2023   Procedure: OPEN REDUCTION INTERNAL FIXATION (ORIF) ANKLE FRACTURE;  Surgeon: Charles Drape, MD;  Location: [facility];  Service: Orthopedics;  Laterality: Left;   SYNDESMOSIS REPAIR  Left 09/26/2023   Procedure: REPAIR, SYNDESMOSIS, ANKLE;  Surgeon: Charles Drape, MD;  Location:  [facility];  Service: Orthopedics;  Laterality: Left;   tumor     tumor removed from neck in 2009    Allergies and medications reviewed and updated.   Current Outpatient Medications:    acetaminophen  (TYLENOL ) 325 MG tablet, Take 650 mg by mouth every 6 (six) hours as needed for mild pain (pain score 1-3) or moderate pain (pain score 4-6)., Disp: , Rfl:    apixaban  (ELIQUIS ) 5 MG TABS tablet, Take 1 tablet (5 mg total) by mouth 2 (two) times daily. Start taking after completion of starter pack., Disp: 60 tablet, Rfl: 1   oxyCODONE (OXY IR/ROXICODONE) 5 MG immediate release tablet, Take 5 mg by mouth every 4 (four) hours as needed for moderate pain (pain score 4-6) or severe pain (pain score 7-10)., Disp: , Rfl:    APIXABAN  (ELIQUIS ) VTE STARTER PACK (10MG  AND 5MG ), Take as directed on package: start with two-5mg  tablets twice daily for 7 days. On day 8, switch to one-5mg  tablet twice daily. (Patient not taking: Reported on 11/13/2023), Disp: 74 each, Rfl: 0  No Known Allergies  Objective:   BP 121/84   Pulse 70   Temp (!) 97.5 F (36.4 C)   Ht 6' 1 (1.854 m)   Wt 215 lb 6.4 oz (97.7 kg)   SpO2 97%   BMI 28.42 kg/m      11/13/2023    9:14 AM 10/02/2023  12:10 PM 09/26/2023    2:21 PM  Vitals with BMI  Height 6' 1    Weight 215 lbs 6 oz    BMI 28.42    Systolic 121 126 890  Diastolic 84 85 97  Pulse 70 76 81     Physical Exam Vitals and nursing note reviewed.  Constitutional:      Appearance: Normal appearance. He is normal weight.  HENT:     Head: Normocephalic and atraumatic.  Skin:    General: Skin is warm and dry.     Capillary Refill: Capillary refill takes less than 2 seconds.  Neurological:     General: No focal deficit present.     Mental Status: He is alert and oriented to person, place, and time. Mental status is at baseline.     Sensory:  Sensory deficit present.     Motor: Weakness present.  Psychiatric:        Mood and Affect: Mood normal.        Behavior: Behavior normal.        Thought Content: Thought content normal.        Judgment: Judgment normal.     Assessment & Plan:  Brachial plexus mass Assessment & Plan: Pt established with Neurosurgery, will return for follow up and plan of care. Left arm pain managed with Oxycodone/Hydrocodone per Ortho. PDMP reviewed. No red flags on exam. No further concerns today. Return for CPE   Prostate cancer screening -     PSA; Future  Screening, anemia, deficiency, iron -     CBC with Differential/Platelet; Future -     ABO AND RH ; Future  Overweight (BMI 25.0-29.9) -     CBC with Differential/Platelet; Future -     Comprehensive metabolic panel with GFR; Future -     Lipid panel; Future  Screening for lipoid disorders -     Lipid panel; Future     Follow up plan: Return for ASAP annual physical with labs 1 week prior.  Charles GORMAN Barrio, FNP

## 2023-11-15 ENCOUNTER — Encounter: Payer: Self-pay | Admitting: Student-PharmD

## 2023-11-15 ENCOUNTER — Ambulatory Visit: Attending: Vascular Surgery | Admitting: Student-PharmD

## 2023-11-15 DIAGNOSIS — I82452 Acute embolism and thrombosis of left peroneal vein: Secondary | ICD-10-CM | POA: Insufficient documentation

## 2023-11-15 NOTE — Progress Notes (Cosign Needed)
 DVT Clinic Note  Name: Charles Baker     MRN: [id number]     DOB: 04-Dec-1970     Sex: male  PCP: Kayla Jeoffrey RAMAN, FNP  Today's Visit: Visit Information: Follow Up Visit  Referred to DVT Clinic by: Orthopedic Surgery - Penne Navy, PA-C ([facility]) Referred to CPP by: Dr. Magda Reason for referral:  Chief Complaint  Patient presents with   Med Management - DVT   HISTORY OF PRESENT ILLNESS: Charles Baker is a 53 y.o. 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 On 08/02/23 patient was diagnosed with a left ankle fracture at an outside ED then underwent ORIF left ankle by Dr. Barton on 09/26/23 and was discharged with aspirin 81 mg BID for DVT prophylaxis. DVT study 10/02/23 showed acute DVT in the left peroneal and posterior tibial veins. No history of DVT. Had been less mobile since his surgery. His left lower leg was in a splint but was moved into a walking boot. Currently smoking 1/2 ppd and not currently interested in quitting. Last seen in DVT Clinic 10/02/23 and Eliquis  was started.  Today, patient reports that he called requesting this follow up visit due to concerns about his swelling not improving. Demonstrates that this is primarily in his foot. Swelling does improve overnight with elevation. He has also experienced worsening nerve pain in his neck and left arm related to his brachial plexus mass and wanted to ensure Eliquis  is not contributing. Denies any new pain or swelling. Initially noticed some blood in stool when he started Eliquis  but this resolved. Otherwise denies abnormal bleeding or bruising. Reports 1 missed doses of Eliquis  since starting it. Is not wearing compression stockings but was recently told by ortho that he could start wearing them. Has been elevating consistently. Starts physical therapy on Tuesday. Using crutches around the house. Saw his PCP this week who  felt that his worsening left arm pain could be related to using the crutches and aggravating the mass and recommended he follow up with neurosurgery to further discuss surgery for this. Is in a wheelchair today with leg in walking boot and accompanied by his mother.   Positive Thrombotic Risk Factors: Recent surgery (within 3 months), Recent trauma (within 3 months), Paralysis, paresis, or recent plaster cast immobilization of lower extremity, Bed rest >72 hours within 3 month, Smoking Bleeding Risk Factors: None Present  Negative Thrombotic Risk Factors: Previous VTE, Recent admission to hospital with acute illness (within 3 months), Central venous catheterization, Sedentary journey lasting >8 hours within 4 weeks, Pregnancy, Within 6 weeks postpartum, Recent cesarean section (within 3 months), Estrogen therapy, Testosterone therapy, Erythropoiesis-stimulating agent, Recent COVID diagnosis (within 3 months), Active cancer, Non-malignant, chronic inflammatory condition, Known thrombophilic condition, Obesity, Older age  Rx Insurance Coverage: Medicaid Rx Affordability: Eliquis  will be $4/month Rx Assistance Provided: None needed at this time Preferred Pharmacy: Starter pack filled at [facility]. [facility]   Past Medical History:  Diagnosis Date   Nerve sheath tumor    left neck    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/2009   L-tumor biopsy  2012   LIGAMENT REPAIR Right 09/26/2023   Procedure: REPAIR, LIGAMENT;  Surgeon: Barton Drape, MD;  Location: [facility];  Service: Orthopedics;  Laterality: Right;   ORIF ANKLE FRACTURE Left 09/26/2023   Procedure: OPEN REDUCTION INTERNAL  FIXATION (ORIF) ANKLE FRACTURE;  Surgeon: Barton Drape, MD;  Location: [facility];  Service: Orthopedics;  Laterality: Left;   SYNDESMOSIS REPAIR Left 09/26/2023   Procedure: REPAIR, SYNDESMOSIS, ANKLE;  Surgeon:  Barton Drape, MD;  Location:  SURGERY CENTER;  Service: Orthopedics;  Laterality: Left;   tumor     tumor removed from neck in 2009    Social History   Socioeconomic History   Marital status: Single    Spouse name: Not on file   Number of children: Not on file   Years of education: Not on file   Highest education level: Not on file  Occupational History   Not on file  Tobacco Use   Smoking status: Every Day    Current packs/day: 0.50    Average packs/day: 0.5 packs/day for 15.0 years (7.5 ttl pk-yrs)    Types: Cigarettes   Smokeless tobacco: Never  Vaping Use   Vaping status: Never Used  Substance and Sexual Activity   Alcohol use: Yes    Alcohol/week: 2.0 standard drinks of alcohol    Types: 2 Shots of liquor per week    Comment: beer occasional    Drug use: Yes    Types: Marijuana    Comment: last time a few days   Sexual activity: Yes  Other Topics Concern   Not on file  Social History Narrative   Not on file   Social Drivers of Health   Financial Resource Strain: Patient Declined (02/09/2023)   Overall Financial Resource Strain (CARDIA)    Difficulty of Paying Living Expenses: Patient declined  Food Insecurity: Patient Declined (02/09/2023)   Hunger Vital Sign    Worried About Running Out of Food in the Last Year: Patient declined    Ran Out of Food in the Last Year: Patient declined  Transportation Needs: Patient Declined (02/09/2023)   PRAPARE - Administrator, Civil Service (Medical): Patient declined    Lack of Transportation (Non-Medical): Patient declined  Physical Activity: Unknown (02/09/2023)   Exercise Vital Sign    Days of Exercise per Week: 6 days    Minutes of Exercise per Session: Patient declined  Stress: No Stress Concern Present (02/09/2023)   [employer] of Occupational Health - Occupational Stress Questionnaire    Feeling of Stress : Not at all  Social Connections: Unknown (02/09/2023)   Social Connection  and Isolation Panel    Frequency of Communication with Friends and Family: Patient declined    Frequency of Social Gatherings with Friends and Family: Patient declined    Attends Religious Services: Patient declined    Database administrator or Organizations: Patient declined    Attends Engineer, structural: Not on file    Marital Status: Patient declined  Intimate Partner Violence: Not on file    Family History  Problem Relation Age of Onset   COPD Maternal Grandmother    Stroke Paternal Grandfather     Allergies as of 11/15/2023   (No Known Allergies)    Current Outpatient Medications on File Prior to Visit  Medication Sig Dispense Refill   apixaban  (ELIQUIS ) 5 MG TABS tablet Take 1 tablet (5 mg total) by mouth 2 (two) times daily. Start taking after completion of starter pack. 60 tablet 1   HYDROcodone-acetaminophen  (NORCO/VICODIN) 5-325 MG tablet Take 1 tablet by mouth every 6 (six) hours as needed for moderate pain (pain score 4-6).     acetaminophen  (TYLENOL ) 325 MG tablet Take 650 mg by  mouth every 6 (six) hours as needed for mild pain (pain score 1-3) or moderate pain (pain score 4-6).     No current facility-administered medications on file prior to visit.   REVIEW OF SYSTEMS:  Review of Systems  Respiratory:  Negative for shortness of breath.   Cardiovascular:  Positive for leg swelling. Negative for chest pain and palpitations.  Musculoskeletal:  Positive for myalgias.  Neurological:  Negative for dizziness and tingling.   PHYSICAL EXAMINATION:  There were no vitals filed for this visit.  Physical Exam Vitals reviewed.  Cardiovascular:     Rate and Rhythm: Normal rate.  Pulmonary:     Effort: Pulmonary effort is normal.  Musculoskeletal:        General: Tenderness present.     Right lower leg: No edema.     Left lower leg: Edema (primarily in foot) present.  Skin:    Findings: Erythema present. No bruising.  Psychiatric:        Mood and Affect: Mood  normal.        Behavior: Behavior normal.        Thought Content: Thought content normal.   Villalta Score for Post-Thrombotic Syndrome: Pain: Mild Cramps: Absent Heaviness: Mild Paresthesia: Absent Pruritus: Absent Pretibial Edema: Mild Skin Induration: Absent Hyperpigmentation: Absent Redness: Absent Venous Ectasia: Absent Pain on calf compression: Mild Villalta Preliminary Score: 4 Is venous ulcer present?: No If venous ulcer is present and score is <15, then 15 points total are assigned: Absent Villalta Total Score: 4  LABS:  CBC     Component Value Date/Time   WBC 7.5 01/20/2022 0852   RBC 4.59 01/20/2022 0852   HGB 15.6 01/20/2022 0852   HCT 43.9 01/20/2022 0852   PLT 176 01/20/2022 0852   MCV 95.6 01/20/2022 0852   MCH 34.0 (H) 01/20/2022 0852   MCHC 35.5 01/20/2022 0852   RDW 12.1 01/20/2022 0852   LYMPHSABS 1,943 01/20/2022 0852   EOSABS 405 01/20/2022 0852   BASOSABS 38 01/20/2022 0852    Hepatic Function      Component Value Date/Time   PROT 7.2 01/20/2022 0852   AST 32 01/20/2022 0852   ALT 30 01/20/2022 0852   BILITOT 0.9 01/20/2022 0852    Renal Function   Lab Results  Component Value Date   CREATININE 0.95 01/20/2022    CrCl cannot be calculated (Patient's most recent lab result is older than the maximum 21 days allowed.).   [facility] Vascular Lab Studies:  10/02/23 VAS US  LOWER EXTREMITY VENOUS BILAT (DVT)  Summary:  RIGHT:  - There is no evidence of deep vein thrombosis in the lower extremity.  - There is no evidence of superficial venous thrombosis.    LEFT:  - Findings consistent with acute deep vein thrombosis involving the left  posterior tibial veins, and left peroneal veins. One of each of the paired PTV and peroneal veins are non-compressible. The other of each pair are partially compressible.  - There is no evidence of superficial venous thrombosis.  - No cystic structure found in the popliteal fossa.   ASSESSMENT: Location of DVT:  Left distal vein Cause of DVT: provoked by a transient risk factor  Patient without prior history of DVT diagnosed on 10/02/23 with acute DVT in the left posterior tibial and peroneal veins s/p ORIF for left ankle fracture on 09/26/23. We have planned to treat a first provoked DVT with 3 months of anticoagulation pending return to normal mobility. He is tolerating Eliquis  well. Discussed  that Eliquis  is not likely to be contributing to his worsening neck/arm pain but more likely to be related to using crutches. Agree with PCP to follow up with neurosurgery. His calf swelling has improved since last visit. He is having persistent swelling in his foot that is likely more related to his recent surgery. No new pain or swelling concerning for propagation and patient has been adherent to Eliquis . Will continue Eliquis  and follow up in September. Encouraged continued compression and elevation. Patient agreeable with this plan and all questions have been answered.   PLAN: -Continue apixaban  (Eliquis ) 5 mg twice daily. -Expected duration of therapy: 3 months. Therapy started on 10/02/23. -Patient educated on purpose, proper use and potential adverse effects of apixaban  (Eliquis ). -Discussed importance of taking medication around the same time every day. -Advised patient of medications to avoid (NSAIDs, aspirin doses >100 mg daily). -Educated that Tylenol  (acetaminophen ) is the preferred analgesic to lower the risk of bleeding. -Advised patient to alert all providers of anticoagulation therapy prior to starting a new medication or having a procedure. -Emphasized importance of monitoring for signs and symptoms of bleeding (abnormal bruising, prolonged bleeding, nose bleeds, bleeding from gums, discolored urine, black tarry stools). -Educated patient to present to the ED if emergent signs and symptoms of new thrombosis occur. -Measured patient for compression stockings. -Counseled patient to wear compression  stockings daily, removing at night. Counseled on proper leg elevation to improve swelling.   Follow up: September in DVT Clinic  Lum Herald, PharmD, [place], CPP Deep Vein Thrombosis Clinic Clinical Pharmacist Practitioner [phone]  I have evaluated the patient's chart/imaging and refer this patient to the Clinical Pharmacist Practitioner for medication management. I have reviewed the CPP's documentation and agree with her assessment and plan. I was immediately available during the visit for questions and collaboration.   Fonda FORBES Rim, MD

## 2023-11-15 NOTE — Patient Instructions (Signed)
-  Continue Eliquis  5 mg twice daily.  -It is important to take your medication around the same time every day.  -Avoid NSAIDs like ibuprofen  (Advil , Motrin ) and naproxen (Aleve) as well as aspirin doses over 100 mg daily. -Tylenol  (acetaminophen ) is the preferred over the counter pain medication to lower the risk of bleeding. -Be sure to alert all of your health care providers that you are taking an anticoagulant prior to starting a new medication or having a procedure. -Monitor for signs and symptoms of bleeding (abnormal bruising, prolonged bleeding, nose bleeds, bleeding from gums, discolored urine, black tarry stools). If you have fallen and hit your head OR if your bleeding is severe or not stopping, seek emergency care.  -Go to the emergency room if emergent signs and symptoms of new clot occur (new or worse swelling and pain in an arm or leg, shortness of breath, chest pain, fast or irregular heartbeats, lightheadedness, dizziness, fainting, coughing up blood) or if you experience a significant color change (pale or blue) in the extremity that has the DVT.  -We recommend you wear compression stockings (20-30 mmHg) as long as you are having swelling or pain. Be sure to purchase the correct size and take them off at night.   If you have any questions or need to reschedule an appointment, please call [phone]. If you are having an emergency, call 911 or present to the nearest emergency room.   What is a DVT?  -Deep vein thrombosis (DVT) is a condition in which a blood clot forms in a vein of the deep venous system which can occur in the lower leg, thigh, pelvis, arm, or neck. This condition is serious and can be life-threatening if the clot travels to the arteries of the lungs and causing a blockage (pulmonary embolism, PE). A DVT can also damage veins in the leg, which can lead to long-term venous disease, leg pain, swelling, discoloration, and ulcers or sores (post-thrombotic syndrome).   -Treatment may include taking an anticoagulant medication to prevent more clots from forming and the current clot from growing, wearing compression stockings, and/or surgical procedures to remove or dissolve the clot.

## 2023-11-19 DIAGNOSIS — M25672 Stiffness of left ankle, not elsewhere classified: Secondary | ICD-10-CM | POA: Insufficient documentation

## 2023-12-02 ENCOUNTER — Encounter: Payer: Self-pay | Admitting: Family Medicine

## 2023-12-12 ENCOUNTER — Other Ambulatory Visit

## 2023-12-12 DIAGNOSIS — E663 Overweight: Secondary | ICD-10-CM

## 2023-12-12 DIAGNOSIS — Z130 Encounter for screening for diseases of the blood and blood-forming organs and certain disorders involving the immune mechanism: Secondary | ICD-10-CM

## 2023-12-12 DIAGNOSIS — Z125 Encounter for screening for malignant neoplasm of prostate: Secondary | ICD-10-CM

## 2023-12-12 DIAGNOSIS — Z1321 Encounter for screening for nutritional disorder: Secondary | ICD-10-CM

## 2023-12-12 DIAGNOSIS — Z13220 Encounter for screening for lipoid disorders: Secondary | ICD-10-CM

## 2023-12-13 LAB — LIPID PANEL
Cholesterol: 202 mg/dL — ABNORMAL HIGH (ref <200)
HDL: 55 mg/dL (ref >=40)
LDL Cholesterol (Calc): 110 mg/dL — ABNORMAL HIGH
Non-HDL Cholesterol (Calc): 147 mg/dL — ABNORMAL HIGH (ref <130)
Total CHOL/HDL Ratio: 3.7 (calc) (ref <5.0)
Triglycerides: 261 mg/dL — ABNORMAL HIGH (ref <150)

## 2023-12-13 LAB — COMPREHENSIVE METABOLIC PANEL WITH GFR
AG Ratio: 1.9 (calc) (ref 1.0–2.5)
ALT: 29 U/L (ref 9–46)
AST: 25 U/L (ref 10–35)
Albumin: 4.5 g/dL (ref 3.6–5.1)
Alkaline phosphatase (APISO): 71 U/L (ref 35–144)
BUN: 9 mg/dL (ref 7–25)
CO2: 25 mmol/L (ref 20–32)
Calcium: 9.4 mg/dL (ref 8.6–10.3)
Chloride: 104 mmol/L (ref 98–110)
Creat: 0.9 mg/dL (ref 0.70–1.30)
Globulin: 2.4 g/dL (ref 1.9–3.7)
Glucose, Bld: 91 mg/dL (ref 65–99)
Potassium: 4 mmol/L (ref 3.5–5.3)
Sodium: 140 mmol/L (ref 135–146)
Total Bilirubin: 0.6 mg/dL (ref 0.2–1.2)
Total Protein: 6.9 g/dL (ref 6.1–8.1)
eGFR: 103 mL/min/1.73m2 (ref >=60)

## 2023-12-13 LAB — HEMOGLOBIN A1C
Hgb A1c MFr Bld: 5.5 % (ref <5.7)
Mean Plasma Glucose: 111 mg/dL
eAG (mmol/L): 6.2 mmol/L

## 2023-12-13 LAB — PSA: PSA: 0.49 ng/mL (ref <=4.00)

## 2023-12-13 LAB — VITAMIN D 25 HYDROXY (VIT D DEFICIENCY, FRACTURES): Vit D, 25-Hydroxy: 18 ng/mL — ABNORMAL LOW (ref 30–100)

## 2023-12-13 LAB — CBC WITH DIFFERENTIAL/PLATELET
Absolute Lymphocytes: 1864 {cells}/uL (ref 850–3900)
Absolute Monocytes: 840 {cells}/uL (ref 200–950)
Basophils Absolute: 48 {cells}/uL (ref 0–200)
Basophils Relative: 0.6 %
Eosinophils Absolute: 392 {cells}/uL (ref 15–500)
Eosinophils Relative: 4.9 %
HCT: 46.6 % (ref 38.5–50.0)
Hemoglobin: 15.9 g/dL (ref 13.2–17.1)
MCH: 33.4 pg — ABNORMAL HIGH (ref 27.0–33.0)
MCHC: 34.1 g/dL (ref 32.0–36.0)
MCV: 97.9 fL (ref 80.0–100.0)
MPV: 11.2 fL (ref 7.5–12.5)
Monocytes Relative: 10.5 %
Neutro Abs: 4856 {cells}/uL (ref 1500–7800)
Neutrophils Relative %: 60.7 %
Platelets: 194 Thousand/uL (ref 140–400)
RBC: 4.76 Million/uL (ref 4.20–5.80)
RDW: 12.4 % (ref 11.0–15.0)
Total Lymphocyte: 23.3 %
WBC: 8 Thousand/uL (ref 3.8–10.8)

## 2023-12-13 LAB — ABO AND RH

## 2023-12-20 ENCOUNTER — Ambulatory Visit: Admitting: Family Medicine

## 2023-12-20 ENCOUNTER — Encounter: Payer: Self-pay | Admitting: Family Medicine

## 2023-12-20 VITALS — BP 119/80 | HR 68 | Temp 97.7°F | Ht 73.0 in | Wt 215.0 lb

## 2023-12-20 DIAGNOSIS — F17210 Nicotine dependence, cigarettes, uncomplicated: Secondary | ICD-10-CM | POA: Diagnosis not present

## 2023-12-20 DIAGNOSIS — G540 Brachial plexus disorders: Secondary | ICD-10-CM

## 2023-12-20 DIAGNOSIS — Z0001 Encounter for general adult medical examination with abnormal findings: Secondary | ICD-10-CM | POA: Diagnosis not present

## 2023-12-20 DIAGNOSIS — Z0000 Encounter for general adult medical examination without abnormal findings: Secondary | ICD-10-CM | POA: Insufficient documentation

## 2023-12-20 DIAGNOSIS — R7989 Other specified abnormal findings of blood chemistry: Secondary | ICD-10-CM

## 2023-12-20 DIAGNOSIS — E663 Overweight: Secondary | ICD-10-CM

## 2023-12-20 DIAGNOSIS — Z1211 Encounter for screening for malignant neoplasm of colon: Secondary | ICD-10-CM

## 2023-12-20 DIAGNOSIS — E782 Mixed hyperlipidemia: Secondary | ICD-10-CM | POA: Diagnosis not present

## 2023-12-20 MED ORDER — NICOTINE 21 MG/24HR TD PT24: 21.0000 mg | MEDICATED_PATCH | Freq: Every day | TRANSDERMAL | 0 refills | Status: AC

## 2023-12-20 MED ORDER — NICOTINE 7 MG/24HR TD PT24: 7.0000 mg | MEDICATED_PATCH | Freq: Every day | TRANSDERMAL | 1 refills | Status: DC

## 2023-12-20 MED ORDER — NICOTINE 14 MG/24HR TD PT24: 14.0000 mg | MEDICATED_PATCH | Freq: Every day | TRANSDERMAL | 0 refills | Status: AC

## 2023-12-20 NOTE — Assessment & Plan Note (Signed)
 Pt established with Neurosurgery, will return for follow up and plan of care.  No further concerns today.

## 2023-12-20 NOTE — Progress Notes (Signed)
 Complete physical exam  Patient: Charles Baker   DOB: 08/15/1970   53 y.o. Male  MRN: [id number]  Subjective:    Chief Complaint  Patient presents with   Annual Exam    Charles Baker is a 53 y.o. male who presents today for a complete physical exam. He reports consuming a general diet. The patient does not participate in regular exercise at present. He generally feels well. He reports sleeping well. He does not have additional problems to discuss today.   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, planning tumor resection  The 10-year ASCVD risk score (Arnett DK, et al., 2019) is: 8.5%   Values used to calculate the score:     Age: 19 years     Clincally relevant sex: Male     Is Non-Hispanic African American: Yes     Diabetic: No     Tobacco smoker: Yes     Systolic Blood Pressure: 119 mmHg     Is BP treated: No     HDL Cholesterol: 55 mg/dL     Total Cholesterol: 202 mg/dL  Health Maintenance  Topic Date Due   Fecal DNA (Cologuard)  Never done   COVID-19 Vaccine (1 - 2024-25 season) 01/05/2024 (Originally 12/31/2022)   Zoster Vaccines- Shingrix (1 of 2) 03/21/2024 (Originally 03/06/2021)   INFLUENZA VACCINE  07/29/2024 (Originally 11/30/2023)   DTaP/Tdap/Td (1 - Tdap) 12/19/2024 (Originally 03/06/1990)   Pneumococcal Vaccine: 50+ Years (1 of 2 - PCV) 12/19/2024 (Originally 03/06/1990)   Hepatitis B Vaccines 19-59 Average Risk (1 of 3 - 19+ 3-dose series) 12/19/2024 (Originally 03/06/1990)   Hepatitis C Screening  Completed   HIV Screening  Completed   HPV VACCINES  Aged Out   Meningococcal B Vaccine  Aged Out     Most recent fall risk assessment:    02/12/2023   12:20 PM   Fall Risk   Falls in the past year? 0  Number falls in past yr: 0  Injury with Fall? 0  Risk for fall due to : No Fall Risks     Most recent depression screenings:    02/12/2023   12:20 PM 01/19/2022   11:17 AM  PHQ 2/9 Scores  PHQ - 2 Score 0 0  PHQ- 9 Score 0 1    Vision:Not within last year  and Dental: No current dental problems and Receives regular dental care  Patient Active Problem List   Diagnosis Date Noted   Physical exam, annual 12/20/2023   Moderate mixed hyperlipidemia not requiring statin therapy 12/20/2023   Cigarette nicotine  dependence without complication 12/20/2023   Low vitamin D  level 12/20/2023   Overweight (BMI 25.0-29.9) 12/20/2023   Closed fracture of lateral malleolus 09/18/2023   Brachial plexus mass 02/12/2023   Past Medical History:  Diagnosis Date   Nerve sheath tumor    left neck   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/2009   L-tumor biopsy  2012   LIGAMENT REPAIR Right 09/26/2023   Procedure: REPAIR, LIGAMENT;  Surgeon: Barton Drape, MD;  Location: [facility];  Service: Orthopedics;  Laterality: Right;   ORIF ANKLE FRACTURE Left 09/26/2023  Procedure: OPEN REDUCTION INTERNAL FIXATION (ORIF) ANKLE FRACTURE;  Surgeon: Barton Drape, MD;  Location: [facility];  Service: Orthopedics;  Laterality: Left;   SYNDESMOSIS REPAIR Left 09/26/2023   Procedure: REPAIR, SYNDESMOSIS, ANKLE;  Surgeon: Barton Drape, MD;  Location: [facility];  Service: Orthopedics;  Laterality: Left;   tumor     tumor removed from neck in 2009   Social History   Tobacco Use   Smoking status: Every Day    Current packs/day: 0.50    Average packs/day: 0.5 packs/day for 22.6 years (11.3 ttl pk-yrs)    Types: Cigarettes    Start date: 2018   Smokeless tobacco: Never  Vaping Use   Vaping status: Never Used  Substance Use Topics   Alcohol use: Yes    Alcohol/week:  1.0 standard drink of alcohol    Types: 1 Shots of liquor per week   Drug use: Yes    Types: Marijuana    Comment: last time a few days   Family History  Problem Relation Age of Onset   COPD Maternal Grandmother    Stroke Paternal Grandfather    No Known Allergies    Patient Care Team: Kayla Jeoffrey RAMAN, FNP as PCP - General (Family Medicine)   Outpatient Medications Prior to Visit  Medication Sig   acetaminophen  (TYLENOL ) 325 MG tablet Take 650 mg by mouth every 6 (six) hours as needed for mild pain (pain score 1-3) or moderate pain (pain score 4-6).   apixaban  (ELIQUIS ) 5 MG TABS tablet Take 1 tablet (5 mg total) by mouth 2 (two) times daily. Start taking after completion of starter pack.   docusate sodium (COLACE) 100 MG capsule Take 100 mg by mouth daily. (Patient not taking: Reported on 12/20/2023)   HYDROcodone-acetaminophen  (NORCO/VICODIN) 5-325 MG tablet Take 1 tablet by mouth every 6 (six) hours as needed for moderate pain (pain score 4-6). (Patient not taking: Reported on 12/20/2023)   [DISCONTINUED] aspirin EC 81 MG tablet Take 81 mg by mouth daily. (Patient not taking: Reported on 12/20/2023)   [DISCONTINUED] ibuprofen  (ADVIL ) 800 MG tablet Take 800 mg by mouth every 8 (eight) hours as needed. (Patient not taking: Reported on 12/20/2023)   [DISCONTINUED] pregabalin (LYRICA) 150 MG capsule Take 150 mg by mouth daily. (Patient not taking: Reported on 12/20/2023)   No facility-administered medications prior to visit.    Review of Systems  Constitutional: Negative.   HENT: Negative.    Eyes: Negative.   Respiratory: Negative.    Cardiovascular: Negative.   Gastrointestinal: Negative.   Genitourinary: Negative.   Musculoskeletal:  Positive for myalgias.  Skin: Negative.   Neurological: Negative.   Endo/Heme/Allergies: Negative.   Psychiatric/Behavioral: Negative.    All other systems reviewed and are negative.         Objective:     BP 119/80   Pulse 68   Temp  97.7 F (36.5 C)   Ht 6' 1 (1.854 m)   Wt 215 lb (97.5 kg)   SpO2 98%   BMI 28.37 kg/m  BP Readings from Last 3 Encounters:  12/20/23 119/80  11/13/23 121/84  10/02/23 126/85   Wt Readings from Last 3 Encounters:  12/20/23 215 lb (97.5 kg)  11/13/23 215 lb 6.4 oz (97.7 kg)  09/26/23 212 lb 4.9 oz (96.3 kg)      Physical Exam Vitals and nursing note reviewed.  Constitutional:      Appearance: Normal appearance. He is overweight.  HENT:  Head: Normocephalic and atraumatic.     Right Ear: Tympanic membrane, ear canal and external ear normal.     Left Ear: Tympanic membrane, ear canal and external ear normal.     Nose: Nose normal.     Mouth/Throat:     Mouth: Mucous membranes are moist.     Pharynx: Oropharynx is clear.  Eyes:     Extraocular Movements: Extraocular movements intact.     Right eye: Normal extraocular motion and no nystagmus.     Left eye: Normal extraocular motion and no nystagmus.     Conjunctiva/sclera: Conjunctivae normal.     Pupils: Pupils are equal, round, and reactive to light.  Cardiovascular:     Rate and Rhythm: Normal rate and regular rhythm.     Pulses: Normal pulses.     Heart sounds: Normal heart sounds.  Pulmonary:     Effort: Pulmonary effort is normal.     Breath sounds: Normal breath sounds.  Abdominal:     General: Bowel sounds are normal.     Palpations: Abdomen is soft.  Genitourinary:    Comments: Deferred using shared decision making Musculoskeletal:        General: Normal range of motion.     Cervical back: Normal range of motion and neck supple.     Comments: LLE CAM boot  Skin:    General: Skin is warm and dry.     Capillary Refill: Capillary refill takes less than 2 seconds.  Neurological:     General: No focal deficit present.     Mental Status: He is alert. Mental status is at baseline.     Motor: Weakness and atrophy present.     Comments: BUE  Psychiatric:        Mood and Affect: Mood normal.        Speech:  Speech normal.        Behavior: Behavior normal.        Thought Content: Thought content normal.        Cognition and Memory: Cognition and memory normal.        Judgment: Judgment normal.      No results found for any visits on 12/20/23. Last CBC Lab Results  Component Value Date   WBC 8.0 12/12/2023   HGB 15.9 12/12/2023   HCT 46.6 12/12/2023   MCV 97.9 12/12/2023   MCH 33.4 (H) 12/12/2023   RDW 12.4 12/12/2023   PLT 194 12/12/2023   Last metabolic panel Lab Results  Component Value Date   GLUCOSE 91 12/12/2023   NA 140 12/12/2023   K 4.0 12/12/2023   CL 104 12/12/2023   CO2 25 12/12/2023   BUN 9 12/12/2023   CREATININE 0.90 12/12/2023   EGFR 103 12/12/2023   CALCIUM 9.4 12/12/2023   PROT 6.9 12/12/2023   BILITOT 0.6 12/12/2023   AST 25 12/12/2023   ALT 29 12/12/2023   Last lipids Lab Results  Component Value Date   CHOL 202 (H) 12/12/2023   HDL 55 12/12/2023   LDLCALC 110 (H) 12/12/2023   TRIG 261 (H) 12/12/2023   CHOLHDL 3.7 12/12/2023   Last hemoglobin A1c Lab Results  Component Value Date   HGBA1C 5.5 12/12/2023   Last thyroid functions No results found for: TSH, T3TOTAL, T4TOTAL, THYROIDAB Last vitamin D  Lab Results  Component Value Date   VD25OH 18 (L) 12/12/2023   Last vitamin B12 and Folate No results found for: VITAMINB12, FOLATE      Assessment &  Plan:    Routine Health Maintenance and Physical Exam   There is no immunization history on file for this patient.  Health Maintenance  Topic Date Due   Fecal DNA (Cologuard)  Never done   COVID-19 Vaccine (1 - 2024-25 season) 01/05/2024 (Originally 12/31/2022)   Zoster Vaccines- Shingrix (1 of 2) 03/21/2024 (Originally 03/06/2021)   INFLUENZA VACCINE  07/29/2024 (Originally 11/30/2023)   DTaP/Tdap/Td (1 - Tdap) 12/19/2024 (Originally 03/06/1990)   Pneumococcal Vaccine: 50+ Years (1 of 2 - PCV) 12/19/2024 (Originally 03/06/1990)   Hepatitis B Vaccines 19-59 Average Risk (1 of 3 -  19+ 3-dose series) 12/19/2024 (Originally 03/06/1990)   Hepatitis C Screening  Completed   HIV Screening  Completed   HPV VACCINES  Aged Out   Meningococcal B Vaccine  Aged Out    Discussed health benefits of physical activity, and encouraged him to engage in regular exercise appropriate for his age and condition.  Problem List Items Addressed This Visit     Brachial plexus mass   Pt established with Neurosurgery, will return for follow up and plan of care.  No further concerns today.      Relevant Medications   nicotine  (NICODERM CQ  - DOSED IN MG/24 HOURS) 21 mg/24hr patch   nicotine  (NICODERM CQ  - DOSED IN MG/24 HOURS) 14 mg/24hr patch (Start on 02/01/2024)   nicotine  (NICODERM CQ  - DOSED IN MG/24 HR) 7 mg/24hr patch (Start on 02/15/2024)   Physical exam, annual - Primary   Today your medical history was reviewed and routine physical exam with labs was performed. Recommend 150 minutes of moderate intensity exercise weekly and consuming a well-balanced diet. Advised to stop smoking if a smoker, avoid smoking if a non-smoker, limit alcohol consumption to 1 drink per day for women and 2 drinks per day for men, and avoid illicit drug use. Counseled on safe sex practices and offered STI testing today. Counseled on the importance of sunscreen use. Counseled in mental health awareness and when to seek medical care. Vaccine maintenance discussed. Appropriate health maintenance items reviewed. Return to office in 1 year for annual physical exam. Declined vaccines today, discussed recommendations and will consider. Cologuard ordered.       Moderate mixed hyperlipidemia not requiring statin therapy   I recommend consuming a heart healthy diet such as Mediterranean diet or DASH diet with whole grains, fruits, vegetable, fish, lean meats, nuts, and olive oil. Limit sweets and processed foods. I also encourage moderate intensity exercise 150 minutes weekly. This is 3-5 times weekly for 30-50 minutes each  session. Goal should be pace of 3 miles/hours, or walking 1.5 miles in 30 minutes. The 10-year ASCVD risk score (Arnett DK, et al., 2019) is: 8.5%       Cigarette nicotine  dependence without complication   3-5 minute discussion regarding the harms of tobacco use, the benefits of cessation, and methods of cessation. Discussed that there are medication options to help with cessation. Provided printed education on steps to quit smoking. Patient is ready to try a medication to help. Start Nicotine  patch 21mg  daily for 6 weeks, then 14mg  daily for 2 weeks, then 7mg  daily for 2 weeks       Relevant Medications   nicotine  (NICODERM CQ  - DOSED IN MG/24 HOURS) 21 mg/24hr patch   nicotine  (NICODERM CQ  - DOSED IN MG/24 HOURS) 14 mg/24hr patch (Start on 02/01/2024)   nicotine  (NICODERM CQ  - DOSED IN MG/24 HR) 7 mg/24hr patch (Start on 02/15/2024)   Low vitamin D  level  Recommended increase in sunlight and dietary vitamin D  as well as a multivitamin with 800 units daily.       Overweight (BMI 25.0-29.9)   Other Visit Diagnoses       Colon cancer screening       Relevant Orders   Cologuard      Return in about 3 months (around 03/21/2024) for labs, cpe 1 year.     Jeoffrey GORMAN Barrio, FNP

## 2023-12-20 NOTE — Assessment & Plan Note (Signed)
 Today your medical history was reviewed and routine physical exam with labs was performed. Recommend 150 minutes of moderate intensity exercise weekly and consuming a well-balanced diet. Advised to stop smoking if a smoker, avoid smoking if a non-smoker, limit alcohol consumption to 1 drink per day for women and 2 drinks per day for men, and avoid illicit drug use. Counseled on safe sex practices and offered STI testing today. Counseled on the importance of sunscreen use. Counseled in mental health awareness and when to seek medical care. Vaccine maintenance discussed. Appropriate health maintenance items reviewed. Return to office in 1 year for annual physical exam. Declined vaccines today, discussed recommendations and will consider. Cologuard ordered.

## 2023-12-20 NOTE — Assessment & Plan Note (Signed)
 3-5 minute discussion regarding the harms of tobacco use, the benefits of cessation, and methods of cessation. Discussed that there are medication options to help with cessation. Provided printed education on steps to quit smoking. Patient is ready to try a medication to help. Start Nicotine  patch 21mg  daily for 6 weeks, then 14mg  daily for 2 weeks, then 7mg  daily for 2 weeks

## 2023-12-20 NOTE — Assessment & Plan Note (Signed)
 Recommended increase in sunlight and dietary vitamin D  as well as a multivitamin with 800 units daily.

## 2023-12-20 NOTE — Assessment & Plan Note (Signed)
 I recommend consuming a heart healthy diet such as Mediterranean diet or DASH diet with whole grains, fruits, vegetable, fish, lean meats, nuts, and olive oil. Limit sweets and processed foods. I also encourage moderate intensity exercise 150 minutes weekly. This is 3-5 times weekly for 30-50 minutes each session. Goal should be pace of 3 miles/hours, or walking 1.5 miles in 30 minutes. The 10-year ASCVD risk score (Arnett DK, et al., 2019) is: 8.5%

## 2023-12-24 ENCOUNTER — Telehealth (INDEPENDENT_AMBULATORY_CARE_PROVIDER_SITE_OTHER): Admitting: Neurosurgery

## 2023-12-24 DIAGNOSIS — G540 Brachial plexus disorders: Secondary | ICD-10-CM | POA: Diagnosis not present

## 2023-12-24 NOTE — Progress Notes (Signed)
 I had a follow-up video visit with Charles Baker today.  He was at home and I was in the office.  Gave consent to go forward with a video visit.  We discussed his left-sided brachial plexus mass.  We have been following him for a left-sided brachial plexus mass.  We were initially discussing brachial plexus mass resection, however unfortunately he had a trauma to his lower extremity required surgical fixation.  He is following up now as he is approximately 2-1/2 months post lower extremity surgery.  He is currently in a boot.  Intermittently uses crutches.  I did discuss with him that I want him to be fully crutch independent prior to any brachioplexus surgery as the incision will likely go very close to his axilla.  I like to get a repeat MRI as it has been greater than 6 months and this could affect our surgical approach and planning.  I placed a referral for a brachial plexus MRI.  Once this is completed we will plan to have a follow-up and discuss surgical resection timing.  [name], MD  Spent a total of 10 minutes on the video visit today.

## 2024-01-02 ENCOUNTER — Ambulatory Visit (INDEPENDENT_AMBULATORY_CARE_PROVIDER_SITE_OTHER): Admitting: Student-PharmD

## 2024-01-02 ENCOUNTER — Ambulatory Visit (HOSPITAL_COMMUNITY)
Admission: RE | Admit: 2024-01-02 | Discharge: 2024-01-02 | Disposition: A | Discharge status: Home / Self Care | Source: Ambulatory Visit | Attending: Neurosurgery | Admitting: Neurosurgery

## 2024-01-02 ENCOUNTER — Encounter: Payer: Self-pay | Admitting: Student-PharmD

## 2024-01-02 DIAGNOSIS — I82452 Acute embolism and thrombosis of left peroneal vein: Secondary | ICD-10-CM | POA: Diagnosis present

## 2024-01-02 DIAGNOSIS — G540 Brachial plexus disorders: Secondary | ICD-10-CM | POA: Diagnosis present

## 2024-01-02 MED ORDER — GADOBUTROL 1 MMOL/ML IV SOLN
10.0000 mL | Freq: Once | INTRAVENOUS | Status: AC | PRN
  Administered 2024-01-02: 10 mL via INTRAVENOUS

## 2024-01-02 MED ORDER — APIXABAN 5 MG PO TABS: 5.0000 mg | ORAL_TABLET | Freq: Two times a day (BID) | ORAL | 0 refills | Status: DC

## 2024-01-02 NOTE — Patient Instructions (Signed)
-  Continue Eliquis  5 mg (1 tablet) twice daily. Take this for 1 more month, then you can discontinue. Call me if you have any concerns.  -Your refills have [facility] to your [facility]. You may need to call the pharmacy to ask them to fill this when you start to run low on your current supply.  -It is important to take your medication around the same time every day.  -Avoid NSAIDs like ibuprofen  (Advil , Motrin ) and naproxen (Aleve) as well as aspirin doses over 100 mg daily. -Tylenol  (acetaminophen ) is the preferred over the counter pain medication to lower the risk of bleeding. -Be sure to alert all of your health care providers that you are taking an anticoagulant prior to starting a new medication or having a procedure. -Monitor for signs and symptoms of bleeding (abnormal bruising, prolonged bleeding, nose bleeds, bleeding from gums, discolored urine, black tarry stools). If you have fallen and hit your head OR if your bleeding is severe or not stopping, seek emergency care.  -Go to the emergency room if emergent signs and symptoms of new clot occur (new or worse swelling and pain in an arm or leg, shortness of breath, chest pain, fast or irregular heartbeats, lightheadedness, dizziness, fainting, coughing up blood) or if you experience a significant color change (pale or blue) in the extremity that has the DVT.  -We recommend you wear compression stockings (20-30 mmHg) as long as you are having swelling or pain. Be sure to purchase the correct size and take them off at night.   If you have any questions or need to reschedule an appointment, please call [phone]. If you are having an emergency, call 911 or present to the nearest emergency room.   What is a DVT?  -Deep vein thrombosis (DVT) is a condition in which a blood clot forms in a vein of the deep venous system which can occur in the lower leg, thigh, pelvis, arm, or neck. This condition is serious and can be life-threatening if the clot travels  to the arteries of the lungs and causing a blockage (pulmonary embolism, PE). A DVT can also damage veins in the leg, which can lead to long-term venous disease, leg pain, swelling, discoloration, and ulcers or sores (post-thrombotic syndrome).  -Treatment may include taking an anticoagulant medication to prevent more clots from forming and the current clot from growing, wearing compression stockings, and/or surgical procedures to remove or dissolve the clot.

## 2024-01-02 NOTE — Progress Notes (Signed)
 DVT Clinic Note  Name: [name]     MRN: [id number]     DOB: 1971-04-11     Sex: male  PCP: Kayla Jeoffrey RAMAN, FNP  Today's Visit: Visit Information: Follow Up Visit  Referred to DVT Clinic by: Orthopedic Surgery - Penne Navy, PA-C ([facility])  Referred to CPP by: Dr. Sheree Reason for referral:  Chief Complaint  Patient presents with   Med Management - DVT   HISTORY OF PRESENT ILLNESS: Charles Baker is a 53 y.o. 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 On 08/02/23 patient was diagnosed with a left ankle fracture at an outside ED then underwent ORIF left ankle by Dr. Barton on 09/26/23 and was discharged with aspirin 81 mg BID for DVT prophylaxis. DVT study 10/02/23 showed acute DVT in the left peroneal and posterior tibial veins. No history of DVT. Had been less mobile since his surgery. His left lower leg was in a splint but was moved into a walking boot. Currently smoking 1/2 ppd and not currently interested in quitting. Initially seen in DVT Clinic 10/02/23 and Eliquis  was started. Last seen 11/15/23 and symptoms were improving.   Today, patient arrives in a wheelchair accompanied by his mother. Reports that he can now bear weight on his left leg and walk but is still requiring the use of crutches. No longer spending long periods of time sedentary but not quite back to his normal activities though is moving in the right direction. Continues to work with physical therapy. He is being evaluated by neurosurgery for left-sided brachial plexus mass resection but they want him to be off crutches before, so he is working toward that now. LLE is in a boot. Reports occasional swelling and pain in leg that improve overnight. Denies abnormal bleeding or bruising. Denies missed doses of Eliquis . Has 1 tablet of Eliquis  left.   Positive Thrombotic Risk Factors: Recent surgery (within 3  months), Recent trauma (within 3 months), Paralysis, paresis, or recent plaster cast immobilization of lower extremity, Bed rest >72 hours within 3 month, Smoking Bleeding Risk Factors: Anticoagulant therapy  Negative Thrombotic Risk Factors: Previous VTE, Recent admission to hospital with acute illness (within 3 months), Central venous catheterization, Sedentary journey lasting >8 hours within 4 weeks, Pregnancy, Within 6 weeks postpartum, Recent cesarean section (within 3 months), Estrogen therapy, Testosterone therapy, Erythropoiesis-stimulating agent, Recent COVID diagnosis (within 3 months), Active cancer, Non-malignant, chronic inflammatory condition, Known thrombophilic condition, Obesity, Older age  Rx Insurance Coverage: Medicaid Rx Affordability: Eliquis  is $4/month  Past Medical History:  Diagnosis Date   Nerve sheath tumor    left neck    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/2009   L-tumor biopsy  2012   LIGAMENT REPAIR Right 09/26/2023   Procedure: REPAIR, LIGAMENT;  Surgeon: Barton Drape, MD;  Location: [facility];  Service: Orthopedics;  Laterality: Right;   ORIF ANKLE FRACTURE Left 09/26/2023   Procedure: OPEN REDUCTION INTERNAL FIXATION (ORIF) ANKLE FRACTURE;  Surgeon: Barton Drape, MD;  Location: [facility];  Service: Orthopedics;  Laterality: Left;   SYNDESMOSIS REPAIR Left 09/26/2023   Procedure: REPAIR, SYNDESMOSIS, ANKLE;  Surgeon: Barton Drape, MD;  Location: [facility];  Service: Orthopedics;  Laterality: Left;   tumor     tumor removed from neck in 2009    Social History   Socioeconomic History  Marital status: Single    Spouse name: Not on file   Number of children: 1   Years of education: Not on file   Highest education level: 12th grade  Occupational History   Not on file  Tobacco Use   Smoking status: Every Day    Current packs/day: 0.50    Average  packs/day: 0.5 packs/day for 22.7 years (11.3 ttl pk-yrs)    Types: Cigarettes    Start date: 2018   Smokeless tobacco: Never  Vaping Use   Vaping status: Never Used  Substance and Sexual Activity   Alcohol use: Yes    Alcohol/week: 1.0 standard drink of alcohol    Types: 1 Shots of liquor per week   Drug use: Yes    Types: Marijuana    Comment: last time a few days   Sexual activity: Yes  Other Topics Concern   Not on file  Social History Narrative   Not on file   Social Drivers of Health   Financial Resource Strain: Patient Declined (02/09/2023)   Overall Financial Resource Strain (CARDIA)    Difficulty of Paying Living Expenses: Patient declined  Food Insecurity: Patient Declined (02/09/2023)   Hunger Vital Sign    Worried About Running Out of Food in the Last Year: Patient declined    Ran Out of Food in the Last Year: Patient declined  Transportation Needs: Patient Declined (02/09/2023)   PRAPARE - Administrator, Civil Service (Medical): Patient declined    Lack of Transportation (Non-Medical): Patient declined  Physical Activity: Unknown (02/09/2023)   Exercise Vital Sign    Days of Exercise per Week: 6 days    Minutes of Exercise per Session: Patient declined  Stress: No Stress Concern Present (02/09/2023)   [employer] of Occupational Health - Occupational Stress Questionnaire    Feeling of Stress : Not at all  Social Connections: Unknown (02/09/2023)   Social Connection and Isolation Panel    Frequency of Communication with Friends and Family: Patient declined    Frequency of Social Gatherings with Friends and Family: Patient declined    Attends Religious Services: Patient declined    Database administrator or Organizations: Patient declined    Attends Engineer, structural: Not on file    Marital Status: Patient declined  Intimate Partner Violence: Not on file    Family History  Problem Relation Age of Onset   COPD Maternal  Grandmother    Stroke Paternal Grandfather     Allergies as of 01/02/2024   (No Known Allergies)    Current Outpatient Medications on File Prior to Visit  Medication Sig Dispense Refill   acetaminophen  (TYLENOL ) 325 MG tablet Take 650 mg by mouth every 6 (six) hours as needed for mild pain (pain score 1-3) or moderate pain (pain score 4-6).     docusate sodium (COLACE) 100 MG capsule Take 100 mg by mouth daily. (Patient not taking: Reported on 12/20/2023)     HYDROcodone-acetaminophen  (NORCO/VICODIN) 5-325 MG tablet Take 1 tablet by mouth every 6 (six) hours as needed for moderate pain (pain score 4-6). (Patient not taking: Reported on 12/20/2023)     [START ON 02/01/2024] nicotine  (NICODERM CQ  - DOSED IN MG/24 HOURS) 14 mg/24hr patch Place 1 patch (14 mg total) onto the skin daily for 14 days. 14 patch 0   nicotine  (NICODERM CQ  - DOSED IN MG/24 HOURS) 21 mg/24hr patch Place 1 patch (21 mg total) onto the skin daily. 42 patch 0   [  START ON 02/15/2024] nicotine  (NICODERM CQ  - DOSED IN MG/24 HR) 7 mg/24hr patch Place 1 patch (7 mg total) onto the skin daily. 14 patch 1   No current facility-administered medications on file prior to visit.   REVIEW OF SYSTEMS:  Review of Systems  Respiratory:  Negative for shortness of breath.   Cardiovascular:  Positive for leg swelling. Negative for chest pain and palpitations.  Musculoskeletal:  Positive for myalgias.  Neurological:  Negative for dizziness and tingling.   PHYSICAL EXAMINATION:  Physical Exam Musculoskeletal:        General: Tenderness present.     Left lower leg: Edema present.  Skin:    Findings: No bruising or erythema.  Psychiatric:        Mood and Affect: Mood normal.        Behavior: Behavior normal.        Thought Content: Thought content normal.   Villalta Score for Post-Thrombotic Syndrome: Pain: Mild Cramps: Absent Heaviness: Mild Paresthesia: Absent Pruritus: Absent Pretibial Edema: Mild Skin Induration:  Absent Hyperpigmentation: Absent Redness: Absent Venous Ectasia: Absent Pain on calf compression: Absent Villalta Preliminary Score: 3 Is venous ulcer present?: No If venous ulcer is present and score is <15, then 15 points total are assigned: Absent Villalta Total Score: 3  LABS:  CBC     Component Value Date/Time   WBC 8.0 12/12/2023 0802   RBC 4.76 12/12/2023 0802   HGB 15.9 12/12/2023 0802   HCT 46.6 12/12/2023 0802   PLT 194 12/12/2023 0802   MCV 97.9 12/12/2023 0802   MCH 33.4 (H) 12/12/2023 0802   MCHC 34.1 12/12/2023 0802   RDW 12.4 12/12/2023 0802   LYMPHSABS 1,943 01/20/2022 0852   EOSABS 392 12/12/2023 0802   BASOSABS 48 12/12/2023 0802    Hepatic Function      Component Value Date/Time   PROT 6.9 12/12/2023 0802   AST 25 12/12/2023 0802   ALT 29 12/12/2023 0802   BILITOT 0.6 12/12/2023 0802    Renal Function   Lab Results  Component Value Date   CREATININE 0.90 12/12/2023   CREATININE 0.95 01/20/2022    CrCl cannot be calculated (Patient's most recent lab result is older than the maximum 21 days allowed.).   VVS Vascular Lab Studies:  10/02/23 VAS US  LOWER EXTREMITY VENOUS BILAT (DVT)  Summary:  RIGHT:  - There is no evidence of deep vein thrombosis in the lower extremity.  - There is no evidence of superficial venous thrombosis.    LEFT:  - Findings consistent with acute deep vein thrombosis involving the left  posterior tibial veins, and left peroneal veins. One of each of the paired PTV and peroneal veins are non-compressible. The other of each pair are partially compressible.  - There is no evidence of superficial venous thrombosis.  - No cystic structure found in the popliteal fossa.   ASSESSMENT: Location of DVT: Left distal vein Cause of DVT: provoked by a transient risk factor  Patient without prior history of DVT diagnosed on 10/02/23 with acute DVT in the left posterior tibial and peroneal veins s/p ORIF for left ankle fracture on 09/26/23.  We have planned to treat a first provoked DVT with 3-6 months of anticoagulation pending return to normal mobility. He has tolerated Eliquis  well. Symptoms have significantly improved. Today marks 3 months of treatment so we can consider discontinuation. While he is moving more now compared to 3 months ago, he is still not ambulating independently. Patient prefers to be  independent of crutches before coming off of Eliquis  and requests one more month of treatment. This is reasonable since we would treat a first DVT for 3-6 months. He has the goal of being off crutches to be able to be eligible for brachial plexus mass resection as well so he is actively working toward this now. Sent refill of Eliquis  to complete one more month then he can discontinue treatment. No need for repeat imaging.  PLAN: -Continue anticoagulation with Eliquis  for 1 more month (start date 10/02/23) then can discontinue. -Patient educated on purpose, proper use and potential adverse effects of apixaban  (Eliquis ). -Discussed importance of taking medication around the same time every day. -Advised patient of medications to avoid (NSAIDs, aspirin doses >100 mg daily). -Educated that Tylenol  (acetaminophen ) is the preferred analgesic to lower the risk of bleeding. -Advised patient to alert all providers of anticoagulation therapy prior to starting a new medication or having a procedure. -Emphasized importance of monitoring for signs and symptoms of bleeding (abnormal bruising, prolonged bleeding, nose bleeds, bleeding from gums, discolored urine, black tarry stools). -Educated patient to present to the ED if emergent signs and symptoms of new thrombosis occur. -Counseled patient on future VTE risk reduction strategies and to inform all future providers of DVT history.  Follow up: DVT Clinic as needed  Lum Herald, PharmD, [place], CPP Deep Vein Thrombosis Clinic Clinical Pharmacist Practitioner [phone]

## 2024-01-07 ENCOUNTER — Telehealth: Payer: Self-pay | Admitting: Family Medicine

## 2024-01-07 NOTE — Telephone Encounter (Signed)
 Copied from CRM [id number]. Topic: Referral - Request for Referral >> Jan 07, 2024  8:41 AM Rosaria BRAVO wrote: Did the patient discuss referral with their provider in the last year? Yes (If No - schedule appointment) (If Yes - send message)  Appointment offered? No  Type of order/referral and detailed reason for visit: GI, needs referral for colonoscopy.  Preference of office, provider, location: Highest recommended, that is local ([place] or [place])   If referral order, have you been seen by this specialty before? No (If Yes, this issue or another issue? When? Where?  Can we respond through MyChart? Yes

## 2024-01-08 ENCOUNTER — Ambulatory Visit: Admitting: Family Medicine

## 2024-01-11 ENCOUNTER — Ambulatory Visit: Payer: Self-pay | Admitting: Neurosurgery

## 2024-02-11 ENCOUNTER — Telehealth: Payer: Self-pay | Admitting: Neurosurgery

## 2024-02-11 NOTE — Telephone Encounter (Signed)
 Pt is wanting to get a MRI done possibly due to having right side pain now. He believes that it is a tumor due to having the same pains/symptoms as his left side as previously. Wondering if you'd like him to come in the office or if he can do a mychart visit for this?

## 2024-02-18 ENCOUNTER — Telehealth: Admitting: Neurosurgery

## 2024-02-25 ENCOUNTER — Telehealth: Admitting: Neurosurgery

## 2024-02-25 DIAGNOSIS — G540 Brachial plexus disorders: Secondary | ICD-10-CM

## 2024-02-25 NOTE — Progress Notes (Signed)
 I had a follow-up video visit today with Charles Baker.  He was at home and I was in the office.  He gave consent to go forward with a video visit.  We are discussing his left-sided brachial plexus tumor today.  He has a history of known left-sided brachial plexus tumor for which we have been following.  We are planning on resection, however he was currently utilizing crutches and wanted to wait until he was independent from this walking assistance as this would cause a significant amount of difficulty with his postoperative recovery.  He does state that he feels like he has right sided symptoms have started to recur and feel like they were even prior to his first surgery for resection.  He is concerned that this may be growing back or getting worse.  He continues to have worsening numbness tingling and radiating pain down his right upper extremity as well.  In order to ensure that we are staging these procedures correctly I like to get an MRI of the right sided brachial plexus to evaluate for any expansion or recurrence of his known right sided brachial plexus tumors.  I would like this to be done prior to our surgical resection of his left-sided brachial plexus tumor.  Ideally we would perform the first resection in January 2026 per patient preference.  Spent a total of 15 minutes on the video visit today.

## 2024-03-12 ENCOUNTER — Ambulatory Visit (HOSPITAL_COMMUNITY)
Admission: RE | Admit: 2024-03-12 | Discharge: 2024-03-12 | Disposition: A | Discharge status: Home / Self Care | Source: Ambulatory Visit | Attending: Neurosurgery | Admitting: Neurosurgery

## 2024-03-12 DIAGNOSIS — G540 Brachial plexus disorders: Secondary | ICD-10-CM | POA: Insufficient documentation

## 2024-03-12 MED ORDER — GADOBUTROL 1 MMOL/ML IV SOLN
10.0000 mL | Freq: Once | INTRAVENOUS | Status: AC | PRN
  Administered 2024-03-12: 10 mL via INTRAVENOUS

## 2024-03-20 ENCOUNTER — Encounter: Payer: Self-pay | Admitting: Student-PharmD

## 2024-03-21 ENCOUNTER — Other Ambulatory Visit

## 2024-03-21 DIAGNOSIS — E782 Mixed hyperlipidemia: Secondary | ICD-10-CM

## 2024-03-26 ENCOUNTER — Ambulatory Visit: Payer: Self-pay | Admitting: Neurosurgery

## 2024-04-30 ENCOUNTER — Encounter (HOSPITAL_BASED_OUTPATIENT_CLINIC_OR_DEPARTMENT_OTHER): Payer: Self-pay | Admitting: Orthopaedic Surgery

## 2024-04-30 ENCOUNTER — Other Ambulatory Visit: Payer: Self-pay

## 2024-05-05 NOTE — Discharge Instructions (Signed)
 Lillia Mountain, MD [facility]  Please read the following information regarding your care after surgery.  Medications   - Oxycodone  5 mg every 6 hours as needed for pain - Aspirin 81 mg twice daily as scheduled to prevent blood clots - Colace 100 mg twice daily as needed for constipation - Zofran  4 mg every 8 hours as needed for nausea/vomiting  We send above prescriptions to your pharmacy on file.  In addition you may also use: ? acetaminophen  (Tylenol ) 500 mg every 4-6 hours as you need for minor to moderate pain  Resume all other routine medications per usual or as directed by your PCP/other specialists.  ? To help prevent blood clots, you should also get up every hour while you are awake to move around.  Weight Bearing ? OK to walk on the operative leg only AFTER the nerve block has completely worn off.  Cast / Splint / Dressing ? Keep your dressing clean and dry.  Don't put anything (coat hanger, pencil, etc) down inside of it.  If it gets wet, please notify the office immediately.  Swelling IMPORTANT: It is normal for you to have swelling where you had surgery. To reduce swelling and pain, keep at least 3 pillows under your leg so that your toes are above your nose and your heel is above the level of your hip.  It may be necessary to keep your foot or leg elevated for several weeks.  This is critical to helping your incisions heal and your pain to feel better.  Follow Up Call my office at [phone] when you are discharged from the hospital or surgery center to schedule an appointment to be seen within 7-10 days after surgery.  Call my office at [phone] if you develop a fever >101.5 F, nausea, vomiting, bleeding from the surgical site or severe pain.

## 2024-05-05 NOTE — H&P (Signed)
 ORTHOPAEDIC SURGERY H&P  Subjective:  The patient presents for  removal of left deep ankle hardware (syndesmosis fixation), possible revision syndesmosis fixation.   Past Medical History:  Diagnosis Date   Nerve sheath tumor    left neck    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/2009   L-tumor biopsy  2012   LIGAMENT REPAIR Right 09/26/2023   Procedure: REPAIR, LIGAMENT;  Surgeon: Barton Drape, MD;  Location: [facility];  Service: Orthopedics;  Laterality: Right;   ORIF ANKLE FRACTURE Left 09/26/2023   Procedure: OPEN REDUCTION INTERNAL FIXATION (ORIF) ANKLE FRACTURE;  Surgeon: Barton Drape, MD;  Location: [facility];  Service: Orthopedics;  Laterality: Left;   SYNDESMOSIS REPAIR Left 09/26/2023   Procedure: REPAIR, SYNDESMOSIS, ANKLE;  Surgeon: Barton Drape, MD;  Location: [facility];  Service: Orthopedics;  Laterality: Left;   tumor     tumor removed from neck in 2009     Show/hide medication list[1]   Allergies[2]  Social History   Socioeconomic History   Marital status: Single    Spouse name: Not on file   Number of children: 1   Years of education: Not on file   Highest education level: 12th grade  Occupational History   Not on file  Tobacco Use   Smoking status: Every Day    Current packs/day: 0.50    Average packs/day: 0.5 packs/day for 23.0 years (11.5 ttl pk-yrs)    Types: Cigarettes    Start date: 2018   Smokeless tobacco: Never  Vaping Use   Vaping status: Never Used  Substance and Sexual Activity   Alcohol use: Yes    Alcohol/week: 1.0 standard drink of alcohol    Types: 1 Shots of liquor per week    Comment: drinks daily vodka 2 drinks/day   Drug use: Yes    Types: Marijuana    Comment: last smoked 04-30-24   Sexual activity: Yes  Other Topics Concern   Not on file  Social History Narrative   Not on file   Social Drivers of Health   Tobacco  Use: High Risk (04/30/2024)   Patient History    Smoking Tobacco Use: Every Day    Smokeless Tobacco Use: Never    Passive Exposure: Not on file  Financial Resource Strain: Patient Declined (02/09/2023)   Overall Financial Resource Strain (CARDIA)    Difficulty of Paying Living Expenses: Patient declined  Food Insecurity: Patient Declined (02/09/2023)   Hunger Vital Sign    Worried About Running Out of Food in the Last Year: Patient declined    Barista in the Last Year: Patient declined  Transportation Needs: Patient Declined (02/09/2023)   PRAPARE - Administrator, Civil Service (Medical): Patient declined    Lack of Transportation (Non-Medical): Patient declined  Physical Activity: Unknown (02/09/2023)   Exercise Vital Sign    Days of Exercise per Week: 6 days    Minutes of Exercise per Session: Patient declined  Stress: No Stress Concern Present (02/09/2023)   [employer] of Occupational Health - Occupational Stress Questionnaire    Feeling of Stress : Not at all  Social Connections: Unknown (02/09/2023)   Social Connection and Isolation Panel    Frequency of Communication with Friends and Family: Patient declined    Frequency of Social Gatherings with Friends and Family: Patient declined    Attends Religious Services: Patient declined    Active Member  of Clubs or Organizations: Patient declined    Attends Banker Meetings: Not on file    Marital Status: Patient declined  Intimate Partner Violence: Not on file  Depression [phone]): Low Risk (02/12/2023)   Depression (PHQ2-9)    PHQ-2 Score: 0  Alcohol Screen: Not on file  Housing: Low Risk (02/09/2023)   Housing    Last Housing Risk Score: 0  Utilities: Not on file  Health Literacy: Not on file     History reviewed. No pertinent family history.   Review of Systems Pertinent items are noted in HPI.  Objective: Vital signs in last 24 hours:    04/30/2024   11:26 AM 12/20/2023     8:18 AM 11/13/2023    9:14 AM  Vitals with BMI  Height 6' 0 6' 1 6' 1  Weight 220 lbs 215 lbs 215 lbs 6 oz  BMI 29.83 28.37 28.42  Systolic  119 121  Diastolic  80 84  Pulse  68 70      EXAM: General: Well nourished, well developed. Awake, alert and oriented to time, place, person. Normal mood and affect. No apparent distress. Breathing room air.  Operative Lower Extremity: Alignment - Neutral Deformity - None Skin intact Tenderness to palpation - none 5/5 TA, PT, GS, Per, EHL, FHL Sensation intact to light touch throughout Palpable DP and PT pulses Special testing: None  The contralateral foot/ankle was examined for comparison and noted to be neurovascularly intact with no localized deformity, swelling, or tenderness.  Imaging Review All images taken were independently reviewed by me.  Assessment/Plan: The clinical and radiographic findings were reviewed and discussed at length with the patient.  The patient presents for  removal of left deep ankle hardware (syndesmosis fixation), possible revision syndesmosis fixation.  We spoke at length about the natural course of these findings. We discussed nonoperative and operative treatment options in detail.  The risks and benefits were presented and reviewed. The risks due to hardware/suture failure and/or irritation (if removing hardware: inability to remove part/all of hardware, recurrent instability), new/persistent infection, stiffness, nerve/vessel/tendon injury or rerupture of repaired tendon, nonunion/malunion, allograft usage, wound healing issues, development of arthritis, failure of this surgery, possibility of external fixation with delayed definitive surgery, need for further surgery, thromboembolic events, anesthesia/medical complications, amputation, death among others were discussed.  Charles Baker  Orthopaedic Surgery [facility]     [1] (Not in an outpatient encounter) [2] No Known Allergies

## 2024-05-07 ENCOUNTER — Ambulatory Visit (HOSPITAL_BASED_OUTPATIENT_CLINIC_OR_DEPARTMENT_OTHER): Admitting: Anesthesiology

## 2024-05-07 ENCOUNTER — Encounter (HOSPITAL_BASED_OUTPATIENT_CLINIC_OR_DEPARTMENT_OTHER): Payer: Self-pay | Admitting: Orthopaedic Surgery

## 2024-05-07 ENCOUNTER — Other Ambulatory Visit: Payer: Self-pay

## 2024-05-07 ENCOUNTER — Ambulatory Visit (HOSPITAL_BASED_OUTPATIENT_CLINIC_OR_DEPARTMENT_OTHER)

## 2024-05-07 ENCOUNTER — Ambulatory Visit (HOSPITAL_BASED_OUTPATIENT_CLINIC_OR_DEPARTMENT_OTHER)
Admission: RE | Admit: 2024-05-07 | Discharge: 2024-05-07 | Disposition: A | Discharge status: Home / Self Care | Attending: Orthopaedic Surgery | Admitting: Orthopaedic Surgery

## 2024-05-07 ENCOUNTER — Encounter (HOSPITAL_BASED_OUTPATIENT_CLINIC_OR_DEPARTMENT_OTHER)
Admission: RE | Disposition: A | Discharge status: Home / Self Care | Payer: Self-pay | Source: Home / Self Care | Attending: Orthopaedic Surgery

## 2024-05-07 DIAGNOSIS — Z01818 Encounter for other preprocedural examination: Secondary | ICD-10-CM

## 2024-05-07 DIAGNOSIS — F17210 Nicotine dependence, cigarettes, uncomplicated: Secondary | ICD-10-CM | POA: Insufficient documentation

## 2024-05-07 DIAGNOSIS — X58XXXD Exposure to other specified factors, subsequent encounter: Secondary | ICD-10-CM | POA: Diagnosis not present

## 2024-05-07 DIAGNOSIS — Z472 Encounter for removal of internal fixation device: Secondary | ICD-10-CM

## 2024-05-07 DIAGNOSIS — S93432D Sprain of tibiofibular ligament of left ankle, subsequent encounter: Secondary | ICD-10-CM | POA: Diagnosis not present

## 2024-05-07 DIAGNOSIS — Z4589 Encounter for adjustment and management of other implanted devices: Secondary | ICD-10-CM | POA: Insufficient documentation

## 2024-05-07 HISTORY — PX: HARDWARE REMOVAL: SHX979

## 2024-05-07 HISTORY — PX: SYNDESMOSIS REPAIR: SHX5182

## 2024-05-07 MED ORDER — ACETAMINOPHEN 500 MG PO TABS
1000.0000 mg | ORAL_TABLET | Freq: Once | ORAL | Status: AC
  Administered 2024-05-07: 1000 mg via ORAL

## 2024-05-07 MED ORDER — LIDOCAINE 2% (20 MG/ML) 5 ML SYRINGE
INTRAMUSCULAR | Status: AC
  Filled 2024-05-07: qty 5

## 2024-05-07 MED ORDER — CEFAZOLIN SODIUM-DEXTROSE 2-4 GM/100ML-% IV SOLN
2.0000 g | INTRAVENOUS | Status: AC
  Administered 2024-05-07: 2 g via INTRAVENOUS

## 2024-05-07 MED ORDER — FENTANYL CITRATE (PF) 100 MCG/2ML IJ SOLN
INTRAMUSCULAR | Status: AC
  Filled 2024-05-07: qty 2

## 2024-05-07 MED ORDER — LACTATED RINGERS IV SOLN: INTRAVENOUS | Status: DC

## 2024-05-07 MED ORDER — CHLORHEXIDINE GLUCONATE 4 % EX SOLN: 60.0000 mL | Freq: Once | CUTANEOUS | Status: DC

## 2024-05-07 MED ORDER — FENTANYL CITRATE (PF) 250 MCG/5ML IJ SOLN
INTRAMUSCULAR | Status: DC | PRN
  Administered 2024-05-07: 50 ug via INTRAVENOUS
  Administered 2024-05-07: 25 ug via INTRAVENOUS
  Administered 2024-05-07: 50 ug via INTRAVENOUS
  Administered 2024-05-07: 25 ug via INTRAVENOUS
  Administered 2024-05-07: 50 ug via INTRAVENOUS

## 2024-05-07 MED ORDER — FENTANYL CITRATE (PF) 100 MCG/2ML IJ SOLN: 25.0000 ug | INTRAMUSCULAR | Status: DC | PRN

## 2024-05-07 MED ORDER — CEFAZOLIN SODIUM-DEXTROSE 2-4 GM/100ML-% IV SOLN
INTRAVENOUS | Status: AC
  Filled 2024-05-07: qty 100

## 2024-05-07 MED ORDER — ONDANSETRON HCL 4 MG/2ML IJ SOLN
INTRAMUSCULAR | Status: AC
  Filled 2024-05-07: qty 2

## 2024-05-07 MED ORDER — ONDANSETRON HCL 4 MG/2ML IJ SOLN
INTRAMUSCULAR | Status: DC | PRN
  Administered 2024-05-07: 4 mg via INTRAVENOUS

## 2024-05-07 MED ORDER — PROPOFOL 10 MG/ML IV BOLUS
INTRAVENOUS | Status: AC
  Filled 2024-05-07: qty 20

## 2024-05-07 MED ORDER — KETOROLAC TROMETHAMINE 30 MG/ML IJ SOLN
INTRAMUSCULAR | Status: AC
  Filled 2024-05-07: qty 1

## 2024-05-07 MED ORDER — MIDAZOLAM HCL (PF) 2 MG/2ML IJ SOLN
INTRAMUSCULAR | Status: DC | PRN
  Administered 2024-05-07: 2 mg via INTRAVENOUS

## 2024-05-07 MED ORDER — OXYCODONE HCL 5 MG PO TABS
ORAL_TABLET | ORAL | Status: AC
  Filled 2024-05-07: qty 1

## 2024-05-07 MED ORDER — ACETAMINOPHEN 500 MG PO TABS
ORAL_TABLET | ORAL | Status: AC
  Filled 2024-05-07: qty 2

## 2024-05-07 MED ORDER — AMISULPRIDE (ANTIEMETIC) 5 MG/2ML IV SOLN: 10.0000 mg | Freq: Once | INTRAVENOUS | Status: DC | PRN

## 2024-05-07 MED ORDER — DEXAMETHASONE SOD PHOSPHATE PF 10 MG/ML IJ SOLN
INTRAMUSCULAR | Status: AC
  Filled 2024-05-07: qty 1

## 2024-05-07 MED ORDER — LIDOCAINE 2% (20 MG/ML) 5 ML SYRINGE
INTRAMUSCULAR | Status: DC | PRN
  Administered 2024-05-07: 100 mg via INTRAVENOUS

## 2024-05-07 MED ORDER — APIXABAN 2.5 MG PO TABS: 2.5000 mg | ORAL_TABLET | Freq: Two times a day (BID) | ORAL | 0 refills | Status: AC

## 2024-05-07 MED ORDER — ONDANSETRON 4 MG PO TBDP: 4.0000 mg | ORAL_TABLET | Freq: Three times a day (TID) | ORAL | 0 refills | Status: AC | PRN

## 2024-05-07 MED ORDER — ONDANSETRON HCL 4 MG/2ML IJ SOLN: 4.0000 mg | Freq: Once | INTRAMUSCULAR | Status: DC | PRN

## 2024-05-07 MED ORDER — ASPIRIN 81 MG PO TBEC: 81.0000 mg | DELAYED_RELEASE_TABLET | Freq: Two times a day (BID) | ORAL | 0 refills | Status: AC

## 2024-05-07 MED ORDER — KETOROLAC TROMETHAMINE 30 MG/ML IJ SOLN
INTRAMUSCULAR | Status: DC | PRN
  Administered 2024-05-07: 30 mg via INTRAVENOUS

## 2024-05-07 MED ORDER — PROPOFOL 10 MG/ML IV BOLUS
INTRAVENOUS | Status: DC | PRN
  Administered 2024-05-07: 200 mg via INTRAVENOUS

## 2024-05-07 MED ORDER — OXYCODONE HCL 5 MG PO TABS: 5.0000 mg | ORAL_TABLET | ORAL | 0 refills | Status: AC | PRN

## 2024-05-07 MED ORDER — DEXAMETHASONE SOD PHOSPHATE PF 10 MG/ML IJ SOLN
INTRAMUSCULAR | Status: DC | PRN
  Administered 2024-05-07: 10 mg via INTRAVENOUS

## 2024-05-07 MED ORDER — BUPIVACAINE-EPINEPHRINE 0.5% -1:200000 IJ SOLN
INTRAMUSCULAR | Status: DC | PRN
  Administered 2024-05-07: 30 mL

## 2024-05-07 MED ORDER — MIDAZOLAM HCL 2 MG/2ML IJ SOLN
INTRAMUSCULAR | Status: AC
  Filled 2024-05-07: qty 2

## 2024-05-07 MED ORDER — OXYCODONE HCL 5 MG PO TABS
5.0000 mg | ORAL_TABLET | Freq: Once | ORAL | Status: AC
  Administered 2024-05-07: 5 mg via ORAL

## 2024-05-07 MED ORDER — DOCUSATE SODIUM 100 MG PO CAPS: 100.0000 mg | ORAL_CAPSULE | Freq: Two times a day (BID) | ORAL | 0 refills | Status: AC

## 2024-05-07 MED ORDER — 0.9 % SODIUM CHLORIDE (POUR BTL) OPTIME
TOPICAL | Status: DC | PRN
  Administered 2024-05-07: 1000 mL

## 2024-05-07 NOTE — Anesthesia Procedure Notes (Signed)
 Procedure Name: LMA Insertion Date/Time: 05/07/2024 10:35 AM  Performed by: Leopoldo Wanda DASEN, CRNAPre-anesthesia Checklist: Patient identified, Emergency Drugs available, Suction available and Patient being monitored Patient Re-evaluated:Patient Re-evaluated prior to induction Oxygen Delivery Method: Circle System Utilized Preoxygenation: Pre-oxygenation with 100% oxygen Induction Type: IV induction Ventilation: Mask ventilation without difficulty LMA: LMA inserted LMA Size: 5.0 Number of attempts: 1 Airway Equipment and Method: Bite block Placement Confirmation: positive ETCO2 Tube secured with: Tape Dental Injury: Teeth and Oropharynx as per pre-operative assessment

## 2024-05-07 NOTE — Transfer of Care (Signed)
 Immediate Anesthesia Transfer of Care Note  Patient: Charles Baker  Procedure(s) Performed: REMOVAL, HARDWARE (Left) REPAIR, SYNDESMOSIS, ANKLE (Left)  Patient Location: PACU  Anesthesia Type:General  Level of Consciousness: awake, alert , oriented, and patient cooperative  Airway & Oxygen Therapy: Patient Spontanous Breathing  Post-op Assessment: Report given to RN and Post -op Vital signs reviewed and stable  Post vital signs: Reviewed and stable  Last Vitals:  Vitals Value Taken Time  BP 139/100 05/07/24 11:15  Temp    Pulse 70 05/07/24 11:17  Resp 14 05/07/24 11:17  SpO2 96 % 05/07/24 11:17    Last Pain:  Vitals:   05/07/24 0932  TempSrc: Temporal  PainSc: 0-No pain      Patients Stated Pain Goal: 1 (05/07/24 0932)  Complications: No notable events documented.

## 2024-05-07 NOTE — Anesthesia Postprocedure Evaluation (Signed)
"   Anesthesia Post Note  Patient: Charles Baker  Procedure(s) Performed: REMOVAL, HARDWARE (Left: Ankle) REPAIR, SYNDESMOSIS, ANKLE (Left: Ankle)     Patient location during evaluation: PACU Anesthesia Type: General Level of consciousness: awake and alert Pain management: pain level controlled Vital Signs Assessment: post-procedure vital signs reviewed and stable Respiratory status: spontaneous breathing, nonlabored ventilation and respiratory function stable Cardiovascular status: blood pressure returned to baseline and stable Postop Assessment: no apparent nausea or vomiting Anesthetic complications: no   No notable events documented.  Last Vitals:  Vitals:   05/07/24 1130 05/07/24 1144  BP: 116/84 (!) 141/91  Pulse: 63 67  Resp: 12 20  Temp:  36.6 C  SpO2: 94% 97%    Last Pain:  Vitals:   05/07/24 1153  TempSrc:   PainSc: 7                  Garnette FORBES Skillern      "

## 2024-05-07 NOTE — Anesthesia Preprocedure Evaluation (Addendum)
"                                    Anesthesia Evaluation  Patient identified by MRN, date of birth, ID band Patient awake    Reviewed: Allergy & Precautions, NPO status , Patient's Chart, lab work & pertinent test results  Airway Mallampati: II  TM Distance: >3 FB Neck ROM: Full    Dental  (+) Teeth Intact, Dental Advisory Given   Pulmonary Current SmokerPatient did not abstain from smoking.   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  Reproductive/Obstetrics                              Anesthesia Physical Anesthesia Plan  ASA: 2  Anesthesia Plan: General   Post-op Pain Management: Tylenol  PO (pre-op)* and Toradol  IV (intra-op)*   Induction: Intravenous  PONV Risk Score and Plan: 2 and Midazolam , Dexamethasone  and Ondansetron   Airway Management Planned: LMA  Additional Equipment:   Intra-op Plan:   Post-operative Plan: Extubation in OR  Informed Consent: I have reviewed the patients History and Physical, chart, labs and discussed the procedure including the risks, benefits and alternatives for the proposed anesthesia with the patient or authorized representative who has indicated his/her understanding and acceptance.     Dental advisory  given  Plan Discussed with: CRNA  Anesthesia Plan Comments:          Anesthesia Quick Evaluation  "

## 2024-05-07 NOTE — Op Note (Signed)
 05/07/2024  11:53 AM   PATIENT: RENNE PLATTS  54 y.o. male  MRN: [id number]   PRE-OPERATIVE DIAGNOSIS:   Left ankle symptomatic orthopaedic hardware   POST-OPERATIVE DIAGNOSIS:   Same   PROCEDURE: 1] Left ankle removal of deep hardware (syndesmosis fixation) 2] Left ankle revision dynamic syndesmosis fixation   SURGEON:  Lillia Mountain, MD   ASSISTANT: None   ANESTHESIA: General, regional   EBL: Minimal   TOURNIQUET:    Total Tourniquet Time Documented: Thigh (Left) - 18 minutes Total: Thigh (Left) - 18 minutes    COMPLICATIONS: None apparent   DISPOSITION: Extubated, awake and stable to recovery.   INDICATION FOR PROCEDURE: The patient presented with above diagnosis.  We discussed the diagnosis, alternative treatment options, risks and benefits of the above surgical intervention, as well as alternative non-operative treatments. All questions/concerns were addressed and the patient/family demonstrated appropriate understanding of the diagnosis, the procedure, the postoperative course, and overall prognosis. The patient wished to proceed with surgical intervention and signed an informed surgical consent as such, in each others presence prior to surgery.   PROCEDURE IN DETAIL: After preoperative consent was obtained and the correct operative site was identified, the patient was brought to the operating room supine on stretcher. General anesthesia was induced. Preoperative antibiotics were administered. Surgical timeout was taken. The patient was then positioned supine. The operative lower extremity was prepped and draped in standard sterile fashion with a tourniquet around the thigh. The extremity was exsanguinated and the tourniquet was inflated to 275 mmHg.  Prior lateralapproach was utilized over the distal fibula and dissection carried down to the level of plate. The syndesmosis fixation was removed completely - three quadricortical  screws.  Next, a dynamic suture fixation system (ZipTight device) was implanted through the fibula plate in cannulated fashion to fix the syndesmosis. Anchor/button position was verified along anteromedial tibial cortex by fluoroscopy. A repeat stress radiograph showed complete stability of the ankle mortise to testing. This was repeated for a second device.   The surgical sites were thoroughly irrigated. The tourniquet was deflated and hemostasis achieved. The skin was closed without tension.    The leg was cleaned with saline and sterile mepitel dressings with gauze were applied. A well padded sterile wrap was applied. The patient was awakened from anesthesia and transported to the recovery room in stable condition.    FOLLOW UP PLAN: -transfer to PACU, then home -strict NWB operative extremity until nerve block wears off, then OK to WBAT, maximum elevation -maintain dressings until follow up -DVT ppx: Aspirin  81 mg twice daily -follow up as outpatient within 7-10 days for wound check -sutures out in 2-3 weeks in outpatient office   RADIOGRAPHS: AP, lateral, oblique and stress radiographs of the operative ankle were obtained intraoperatively. These showed interval removal of the syndesmosis screws. Manual stress radiographs were taken and the joints were noted to be stable following hardware removal and revision fixation. No other acute injuries are noted.   Lillia Mountain [facility]

## 2024-05-07 NOTE — H&P (Signed)
 H&P Update:  -History and Physical Reviewed  -Patient has been re-examined  -No change in the plan of care  -The risks and benefits were presented and reviewed. The risks due to hardware/suture failure and/or irritation (if removing hardware: inability to remove part/all of hardware, recurrent instability), new/persistent infection, stiffness, nerve/vessel/tendon injury or rerupture of repaired tendon, nonunion/malunion, allograft usage, wound healing issues, development of arthritis, failure of this surgery, possibility of external fixation with delayed definitive surgery, need for further surgery, thromboembolic events, anesthesia/medical complications, amputation, death among others were discussed. The patient acknowledged the explanation, agreed to proceed with the plan and a consent was signed.  Lillia Mountain

## 2024-05-09 ENCOUNTER — Encounter (HOSPITAL_BASED_OUTPATIENT_CLINIC_OR_DEPARTMENT_OTHER): Payer: Self-pay | Admitting: Orthopaedic Surgery

## 2024-12-16 ENCOUNTER — Other Ambulatory Visit

## 2024-12-23 ENCOUNTER — Encounter: Admitting: Family Medicine
